# Patient Record
Sex: Female | Born: 1995
Health system: Southern US, Community
[De-identification: ages and names within clinical notes are randomized; demographics above are authoritative.]

## PROBLEM LIST (undated history)

## (undated) DIAGNOSIS — F329 Major depressive disorder, single episode, unspecified: Secondary | ICD-10-CM

## (undated) DIAGNOSIS — F32A Depression, unspecified: Secondary | ICD-10-CM

## (undated) DIAGNOSIS — N920 Excessive and frequent menstruation with regular cycle: Secondary | ICD-10-CM

## (undated) DIAGNOSIS — N946 Dysmenorrhea, unspecified: Secondary | ICD-10-CM

## (undated) HISTORY — DX: Excessive and frequent menstruation with regular cycle: N92.0

## (undated) HISTORY — DX: Dysmenorrhea, unspecified: N94.6

## (undated) HISTORY — DX: Depression, unspecified: F32.A

## (undated) HISTORY — PX: COLONOSCOPY: SHX174

---
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
## 2015-04-09 ENCOUNTER — Ambulatory Visit (INDEPENDENT_AMBULATORY_CARE_PROVIDER_SITE_OTHER): Payer: PRIVATE HEALTH INSURANCE | Admitting: Obstetrics and Gynecology

## 2015-04-09 ENCOUNTER — Encounter: Payer: Self-pay | Admitting: Obstetrics and Gynecology

## 2015-04-09 VITALS — BP 104/67 | HR 92 | Ht 64.0 in | Wt 116.0 lb

## 2015-04-09 DIAGNOSIS — N946 Dysmenorrhea, unspecified: Secondary | ICD-10-CM | POA: Diagnosis not present

## 2015-04-09 DIAGNOSIS — N920 Excessive and frequent menstruation with regular cycle: Secondary | ICD-10-CM

## 2015-04-09 DIAGNOSIS — N943 Premenstrual tension syndrome: Secondary | ICD-10-CM | POA: Diagnosis not present

## 2015-04-09 NOTE — Progress Notes (Signed)
Patient ID: Ashley Blevins, female   DOB: 1996-01-31, 19 y.o.   MRN: [id number] np wants pill to control heavy bleeding Cycle comes q 30 days- last 6-7 days, 4-5 heavy days- changing q 2 hours Pos cramps - relieved with heating pad and ibup most times  Chief complaint: 1.  Dysmenorrhea. 2.  Menorrhagia. 26.  PMS  19 year old P0 white female, not sexually active, on no birth control, who presents for management of above complaints.  She has no significant health issues.  She is starting college at [employer] in 1 week. Patient has history of severe cramps requiring upwards of 10-12.  Advil a day.  She also has had heavy bleeding in association with the cramps that has occasionally caused her to miss school. Medication patient does have irritability symptoms that began within 1-2 weeks prior to menses.  History reviewed. No pertinent past medical history. Past Surgical History  Procedure Laterality Date  . Wisdom tooth extraction  2013     OBJECTIVE: BP 104/67 mmHg  Pulse 92  Ht  (1.626 m)  Wt 116 lb (52.617 kg)  BMI 19.90 kg/m2  LMP 03/11/2015 (Exact Date) Patient is a pleasant, well-appearing teen in no acute distress  IMPRESSION: 1.  Primary dysmenorrhea. 2.  Menorrhagia. 3.  PMS.  PLAN: 1.  Begin low-dose OCP (Minastrin 24) 2.  Menstrual calendar, monitoring 3.  Symptom monitoring. 4.  Exercise and healthy eating encouraged 5.  Return in 3 months for follow-up on birth control pills and Blood pressure assessment.  A total of 30 minutes were spent face-to-face with the patient during the encounter with greater than 50% dealing with counseling and coordination of care.

## 2015-04-09 NOTE — Patient Instructions (Signed)
1.  Begin low-dose OCP (Minastrin 24) 2.  Menstrual calendar, monitoring 3.  Symptom monitoring. 4.  Exercise and healthy eating encouraged 5.  Return in 3 months for follow-up on birth control pills and Blood pressure assessment.

## 2015-06-18 ENCOUNTER — Telehealth: Payer: Self-pay | Admitting: Obstetrics and Gynecology

## 2015-06-18 NOTE — Telephone Encounter (Signed)
Pt has been on ocp since 03/2015- she states she is taking it same time qd. Having bleeding q 2 weeks. Advised to continue with ocp. Continue with menstrual calendar monitoring. f/u appt made for 11/15.

## 2015-06-18 NOTE — Telephone Encounter (Signed)
pts mother called and dr Tom[facility]umpsde [facility], and she has not had a period when she is suppose to, she is having break thru bleeding, she said taht the break thru bleeding is a full on period, and its not hte time she is suppose to be on her period, so she wanted to know what her daughter needs to do. Does the [facility] need to change or does Dr Tommi Rumpse need to see her? Mother said we can call her daughter back she was just calling in for her.

## 2015-07-09 ENCOUNTER — Ambulatory Visit: Payer: PRIVATE HEALTH INSURANCE | Admitting: Obstetrics and Gynecology

## 2015-07-14 ENCOUNTER — Other Ambulatory Visit: Payer: Self-pay | Admitting: Obstetrics and Gynecology

## 2015-07-14 MED ORDER — NORETHIN ACE-ETH ESTRAD-FE 1-20 MG-MCG(24) PO CHEW: 1.0000 | CHEWABLE_TABLET | Freq: Every morning | ORAL | Status: DC

## 2015-07-17 ENCOUNTER — Encounter: Payer: Self-pay | Admitting: Obstetrics and Gynecology

## 2015-07-17 ENCOUNTER — Ambulatory Visit (INDEPENDENT_AMBULATORY_CARE_PROVIDER_SITE_OTHER): Payer: PRIVATE HEALTH INSURANCE | Admitting: Obstetrics and Gynecology

## 2015-07-17 VITALS — BP 100/64 | HR 84 | Ht 64.0 in | Wt 113.3 lb

## 2015-07-17 DIAGNOSIS — N946 Dysmenorrhea, unspecified: Secondary | ICD-10-CM | POA: Diagnosis not present

## 2015-07-17 DIAGNOSIS — N920 Excessive and frequent menstruation with regular cycle: Secondary | ICD-10-CM | POA: Diagnosis not present

## 2015-07-17 DIAGNOSIS — N943 Premenstrual tension syndrome: Secondary | ICD-10-CM | POA: Diagnosis not present

## 2015-07-17 DIAGNOSIS — B2790 Infectious mononucleosis, unspecified without complication: Secondary | ICD-10-CM | POA: Diagnosis not present

## 2015-07-17 MED ORDER — NORETHIN ACE-ETH ESTRAD-FE 1-20 MG-MCG(24) PO CHEW: 1.0000 | CHEWABLE_TABLET | Freq: Every morning | ORAL | Status: DC

## 2015-07-17 NOTE — Progress Notes (Signed)
Patient ID: Ashley Blevins, female   DOB: 1995/12/29, 19 y.o.   MRN: [id number]  3 month f/u on ocp  cycles this month lasted only 5 days- no pain  Chief complaint: 1.  Dysmenorrhea. 2.  Menorrhagia. 3.  PMDD.  Patient presents today for follow-up on above issues.  She was started on On oral contraceptives. She has not been Having abnormal uterine bleeding; dysmenorrhea has diminished where she no Longer needs to take ibuprofen; her moods have improved to where she is not experiencing any significant PMS symptoms.  [name] just started at [employer] and has had a difficult first semester, having contracted mononucleosis causing her to miss several weeks of school.  She is now recuperated and is coping well.  OBJECTIVE BP 100/64 mmHg  Pulse 84  Ht 5\' 4"  (1.626 m)  Wt 113 lb 4.8 oz (51.393 kg)  BMI 19.44 kg/m2  LMP 07/10/2015   ASSESSMENT: 1.  Dysmenorrhea, resolved. 2.  Menorrhagia with irregular cycles, resolved. 3.  PMDD, reduced  PLAN: Continue with oral contraceptives as written Return in April 2017 for annual follow-up  A total of 15 minutes were spent face-to-face with the patient during this encounter and over half of that time dealt with counseling and coordination of care.  Herold HarmsMartin A Kashae Carstens, MD

## 2015-07-17 NOTE — Patient Instructions (Signed)
1.  Continue with oral contraceptives  2.  Monitor for any abnormal uterine bleeding, worsening pelvic pain, or irritability.

## 2015-12-18 ENCOUNTER — Telehealth: Payer: Self-pay | Admitting: *Deleted

## 2015-12-18 NOTE — Telephone Encounter (Signed)
Patient mother came into office today and states that the patient  is experiencing some high anxiety. Patient mother wants to talk to the nurse or Dr. Tommi Rumpse to see what she can do for her daughter. The patient is still in college and may be able to come in for an appt. Patient mother states she needs some advise on what to do to help her daughter. Patient mother is requesting a call back  Her call back is [phone]

## 2015-12-19 NOTE — Telephone Encounter (Signed)
Per kc- Amy states [name] can not come in tomorrow at 8. Per Amy mad will speak to her tomorrow and let us know when she can come in.

## 2016-01-27 ENCOUNTER — Other Ambulatory Visit: Payer: Self-pay

## 2016-01-28 MED ORDER — SERTRALINE HCL 50 MG PO TABS: 50.0000 mg | ORAL_TABLET | Freq: Every day | ORAL | Status: DC

## 2016-01-28 NOTE — Telephone Encounter (Signed)
Per mad.

## 2016-03-24 ENCOUNTER — Ambulatory Visit: Payer: PRIVATE HEALTH INSURANCE | Admitting: Obstetrics and Gynecology

## 2016-03-31 ENCOUNTER — Ambulatory Visit: Payer: PRIVATE HEALTH INSURANCE | Admitting: Obstetrics and Gynecology

## 2016-04-08 ENCOUNTER — Ambulatory Visit (INDEPENDENT_AMBULATORY_CARE_PROVIDER_SITE_OTHER): Payer: PRIVATE HEALTH INSURANCE | Admitting: Obstetrics and Gynecology

## 2016-04-08 ENCOUNTER — Encounter: Payer: Self-pay | Admitting: Obstetrics and Gynecology

## 2016-04-08 VITALS — BP 112/76 | HR 69 | Wt 112.0 lb

## 2016-04-08 DIAGNOSIS — N946 Dysmenorrhea, unspecified: Secondary | ICD-10-CM | POA: Diagnosis not present

## 2016-04-08 DIAGNOSIS — N920 Excessive and frequent menstruation with regular cycle: Secondary | ICD-10-CM

## 2016-04-08 DIAGNOSIS — N943 Premenstrual tension syndrome: Secondary | ICD-10-CM

## 2016-04-08 MED ORDER — SERTRALINE HCL 50 MG PO TABS: 50.0000 mg | ORAL_TABLET | Freq: Every day | ORAL | 6 refills | Status: DC

## 2016-04-08 MED ORDER — NORETHIN ACE-ETH ESTRAD-FE 1-20 MG-MCG(24) PO CHEW: 1.0000 | CHEWABLE_TABLET | Freq: Every morning | ORAL | 1 refills | Status: DC

## 2016-04-08 NOTE — Progress Notes (Signed)
Chief complaint: 1. Anxiety/depression 2. Dysmenorrhea 3. Menorrhagia  Patient presents for follow-up on sertraline medication prescribed for significant anxiety/depression symptoms. Patient has been getting counseling. She is doing well with counseling and medication. No side effects of headaches or abdominal pelvic cramping  Patient is also on oral contraceptives for regulation of dysmenorrhea and menorrhagia. Menses are very light and painless.  OBJECTIVE: BP 112/76   Pulse 69   Wt 112 lb (50.8 kg)   LMP 03/21/2016 (Exact Date)   BMI 19.22 kg/m   Mood-normal Affect-normal  Assessment 1. Anxiety/depression, stable on medication 2. Dysmenorrhea, resolved with OCPs 3. Menorrhagia, resolved with OCPs  PLAN: 1. Continue Zoloft 50 mg a day 2. Continue OCPs 3. Return in 6 months for follow-up  A total of 15 minutes were spent face-to-face with the patient during this encounter and over half of that time dealt with counseling and coordination of care.  Herold HarmsMartin A Dashawna Delbridge, MD  Note: This dictation was prepared with Dragon dictation along with smaller phrase technology. Any transcriptional errors that result from this process are unintentional.

## 2016-04-08 NOTE — Patient Instructions (Signed)
1. Continue Zoloft 50 mg a day 2. Continue birth control pills 3. Return in 6 months for follow-up

## 2016-10-13 ENCOUNTER — Ambulatory Visit: Payer: PRIVATE HEALTH INSURANCE | Admitting: Obstetrics and Gynecology

## 2016-11-04 ENCOUNTER — Encounter: Payer: Self-pay | Admitting: Obstetrics and Gynecology

## 2016-11-04 ENCOUNTER — Ambulatory Visit (INDEPENDENT_AMBULATORY_CARE_PROVIDER_SITE_OTHER): Payer: PRIVATE HEALTH INSURANCE | Admitting: Obstetrics and Gynecology

## 2016-11-04 VITALS — BP 108/74 | HR 94 | Ht 64.0 in | Wt 123.9 lb

## 2016-11-04 DIAGNOSIS — N920 Excessive and frequent menstruation with regular cycle: Secondary | ICD-10-CM | POA: Diagnosis not present

## 2016-11-04 DIAGNOSIS — N943 Premenstrual tension syndrome: Secondary | ICD-10-CM | POA: Diagnosis not present

## 2016-11-04 DIAGNOSIS — N946 Dysmenorrhea, unspecified: Secondary | ICD-10-CM

## 2016-11-04 MED ORDER — SERTRALINE HCL 50 MG PO TABS: 50.0000 mg | ORAL_TABLET | Freq: Every day | ORAL | 1 refills | Status: DC

## 2016-11-04 NOTE — Patient Instructions (Signed)
1. Continue taking birth control pills as written 2. Continue taking Zoloft 50 mg a day 3. Return in December 2018 for annual exam-first Pap smear

## 2016-11-04 NOTE — Progress Notes (Signed)
Chief complaint: 1. Contraceptive management 2. History of dysmenorrhea 3. History of PMS 4. Anxiety  Patient presents for 6 month follow-up. She is doing very well. Zoloft 50 mg a day is helping to control anxiety symptoms: She is not expressing any side effects. Birth control pills are working well for controlling dysmenorrhea; periods are very light without cramps.  Past medical history, past surgical history, problem list, medications, and allergies are reviewed  Review of Systems  Constitutional: Negative for chills, fever, malaise/fatigue and weight loss.  HENT: Negative.  Negative for ear pain.   Eyes: Negative.   Respiratory: Positive for cough. Negative for shortness of breath.   Cardiovascular: Negative.  Negative for chest pain and palpitations.  Gastrointestinal: Negative for constipation, diarrhea, nausea and vomiting.  Genitourinary: Negative.   Musculoskeletal: Negative for myalgias.  Neurological: Positive for headaches.  Psychiatric/Behavioral:       Anxiety, controlled on medication PMS, controlled on medication   OBJECTIVE: BP 108/74   Pulse 94   Ht 5\' 4"  (1.626 m)   Wt 123 lb 14.4 oz (56.2 kg)   LMP 11/01/2016 (Exact Date)   BMI 21.27 kg/m  Physical exam-deferred  ASSESSMENT: 1. History of dysmenorrhea, controlled on medication-OCPs 2. History of PMS, controlled on medication 3. Anxiety, controlled on medication-Zoloft  PLAN: 1. Continue birth control pills as written 2. Continue with Zoloft 50 mg a day 3. Return in mid December for her first annual exam with Pap smear  A total of 15 minutes were spent face-to-face with the patient during this encounter and over half of that time dealt with counseling and coordination of care.  Herold HarmsMartin A Defrancesco, MD  Note: This dictation was prepared with Dragon dictation along with smaller phrase technology. Any transcriptional errors that result from this process are unintentional.

## 2016-11-27 ENCOUNTER — Other Ambulatory Visit: Payer: Self-pay | Admitting: Obstetrics and Gynecology

## 2017-06-09 ENCOUNTER — Other Ambulatory Visit: Payer: Self-pay | Admitting: Obstetrics and Gynecology

## 2017-06-09 NOTE — Telephone Encounter (Signed)
See message.

## 2017-06-09 NOTE — Telephone Encounter (Signed)
Patient is leaving @ 5am tomorrow morning to go out of town and needs to pick up medication today - [facility]  Please call

## 2017-06-14 ENCOUNTER — Other Ambulatory Visit: Payer: Self-pay | Admitting: *Deleted

## 2017-06-14 MED ORDER — MIBELAS 24 FE 1-20 MG-MCG(24) PO CHEW: CHEWABLE_TABLET | ORAL | 1 refills | Status: DC

## 2017-08-10 ENCOUNTER — Encounter: Payer: PRIVATE HEALTH INSURANCE | Admitting: Obstetrics and Gynecology

## 2017-08-12 ENCOUNTER — Other Ambulatory Visit: Payer: Self-pay | Admitting: Obstetrics and Gynecology

## 2017-08-26 ENCOUNTER — Ambulatory Visit (INDEPENDENT_AMBULATORY_CARE_PROVIDER_SITE_OTHER): Payer: PRIVATE HEALTH INSURANCE | Admitting: Obstetrics and Gynecology

## 2017-08-26 ENCOUNTER — Other Ambulatory Visit: Payer: Self-pay | Admitting: Obstetrics and Gynecology

## 2017-08-26 ENCOUNTER — Encounter: Payer: Self-pay | Admitting: Obstetrics and Gynecology

## 2017-08-26 VITALS — BP 114/79 | HR 92 | Ht 64.0 in | Wt 133.3 lb

## 2017-08-26 DIAGNOSIS — Z01419 Encounter for gynecological examination (general) (routine) without abnormal findings: Secondary | ICD-10-CM | POA: Diagnosis not present

## 2017-08-26 NOTE — Patient Instructions (Signed)
Preventive Care 18-39 Years, Female Preventive care refers to lifestyle choices and visits with your health care provider that can promote health and wellness. What does preventive care include?  A yearly physical exam. This is also called an annual well check.  Dental exams once or twice a year.  Routine eye exams. Ask your health care provider how often you should have your eyes checked.  Personal lifestyle choices, including: ? Daily care of your teeth and gums. ? Regular physical activity. ? Eating a healthy diet. ? Avoiding tobacco and drug use. ? Limiting alcohol use. ? Practicing safe sex. ? Taking vitamin and mineral supplements as recommended by your health care provider. What happens during an annual well check? The services and screenings done by your health care provider during your annual well check will depend on your age, overall health, lifestyle risk factors, and family history of disease. Counseling Your health care provider may ask you questions about your:  Alcohol use.  Tobacco use.  Drug use.  Emotional well-being.  Home and relationship well-being.  Sexual activity.  Eating habits.  Work and work Statistician.  Method of birth control.  Menstrual cycle.  Pregnancy history.  Screening You may have the following tests or measurements:  Height, weight, and BMI.  Diabetes screening. This is done by checking your blood sugar (glucose) after you have not eaten for a while (fasting).  Blood pressure.  Lipid and cholesterol levels. These may be checked every 5 years starting at age 38.  Skin check.  Hepatitis C blood test.  Hepatitis B blood test.  Sexually transmitted disease (STD) testing.  BRCA-related cancer screening. This may be done if you have a family history of breast, ovarian, tubal, or peritoneal cancers.  Pelvic exam and Pap test. This may be done every 3 years starting at age 38. Starting at age 30, this may be done  every 5 years if you have a Pap test in combination with an HPV test.  Discuss your test results, treatment options, and if necessary, the need for more tests with your health care provider. Vaccines Your health care provider may recommend certain vaccines, such as:  Influenza vaccine. This is recommended every year.  Tetanus, diphtheria, and acellular pertussis (Tdap, Td) vaccine. You may need a Td booster every 10 years.  Varicella vaccine. You may need this if you have not been vaccinated.  HPV vaccine. If you are 39 or younger, you may need three doses over 6 months.  Measles, mumps, and rubella (MMR) vaccine. You may need at least one dose of MMR. You may also need a second dose.  Pneumococcal 13-valent conjugate (PCV13) vaccine. You may need this if you have certain conditions and were not previously vaccinated.  Pneumococcal polysaccharide (PPSV23) vaccine. You may need one or two doses if you smoke cigarettes or if you have certain conditions.  Meningococcal vaccine. One dose is recommended if you are age 68-21 years and a first-year college student living in a residence hall, or if you have one of several medical conditions. You may also need additional booster doses.  Hepatitis A vaccine. You may need this if you have certain conditions or if you travel or work in places where you may be exposed to hepatitis A.  Hepatitis B vaccine. You may need this if you have certain conditions or if you travel or work in places where you may be exposed to hepatitis B.  Haemophilus influenzae type b (Hib) vaccine. You may need this  if you have certain risk factors.  Talk to your health care provider about which screenings and vaccines you need and how often you need them. This information is not intended to replace advice given to you by your health care provider. Make sure you discuss any questions you have with your health care provider. Document Released: 10/06/2001 Document Revised:  04/29/2016 Document Reviewed: 06/11/2015 Elsevier Interactive Patient Education  2018 Elsevier Inc.  

## 2017-08-26 NOTE — Progress Notes (Signed)
  Subjective:     Ashley Blevins is a single white  22 y.o. female and is here for a comprehensive physical exam. 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 Lives in a house with roommates. Is exercising regularly. Not sexually active. No smoking and occasional alcohol intake.  The patient reports no problems.  Social History   Socioeconomic History  . Marital status: Single    Spouse name: Not on file  . Number of children: Not on file  . Years of education: Not on file  . Highest education level: Not on file  Social Needs  . Financial resource strain: Not on file  . Food insecurity - worry: Not on file  . Food insecurity - inability: Not on file  . Transportation needs - medical: Not on file  . Transportation needs - non-medical: Not on file  Occupational History  . Not on file  Tobacco Use  . Smoking status: Never Smoker  . Smokeless tobacco: Never Used  Substance and Sexual Activity  . Alcohol use: No  . Drug use: No  . Sexual activity: Yes    Birth control/protection: Pill, Condom  Other Topics Concern  . Not on file  Social History Narrative  . Not on file   Health Maintenance  Topic Date Due  . CHLAMYDIA SCREENING  06/16/2011  . HIV Screening  06/16/2011  . TETANUS/TDAP  06/16/2015  . INFLUENZA VACCINE  03/24/2017  . PAP SMEAR  06/15/2017    The following portions of the patient's history were reviewed and updated as appropriate: allergies, current medications, past family history, past medical history, past social history, past surgical history and problem list.  Review of Systems Pertinent items noted in HPI and remainder of comprehensive ROS otherwise negative.   Objective:    General appearance: alert, cooperative and appears stated age Neck: no adenopathy, no carotid bruit, no JVD, supple, symmetrical, trachea midline and thyroid not enlarged, symmetric, no tenderness/mass/nodules Lungs: clear to auscultation bilaterally Breasts: normal  appearance, no masses or tenderness Heart: regular rate and rhythm, S1, S2 normal, no murmur, click, rub or gallop Abdomen: soft, non-tender; bowel sounds normal; no masses,  no organomegaly Pelvic: cervix normal in appearance, external genitalia normal, no adnexal masses or tenderness, no cervical motion tenderness, rectovaginal septum normal, uterus normal size, shape, and consistency, vagina normal without discharge and 3 small dark nevi noted scattered on labia    Assessment:    Healthy female exam. OCP user; depression under good control with SSRI     Plan:    RTC 1 year or as needed.  Pablo Mathurin,CNM See After Visit Summary for Counseling Recommendations

## 2017-08-27 LAB — CYTOLOGY - PAP

## 2017-09-20 ENCOUNTER — Other Ambulatory Visit: Payer: Self-pay

## 2017-09-20 MED ORDER — SERTRALINE HCL 50 MG PO TABS: 50.0000 mg | ORAL_TABLET | Freq: Every day | ORAL | 1 refills | Status: DC

## 2017-11-23 ENCOUNTER — Other Ambulatory Visit: Payer: Self-pay | Admitting: Obstetrics and Gynecology

## 2018-03-08 ENCOUNTER — Telehealth: Payer: Self-pay | Admitting: Obstetrics and Gynecology

## 2018-03-08 MED ORDER — SERTRALINE HCL 50 MG PO TABS: 50.0000 mg | ORAL_TABLET | Freq: Every day | ORAL | 1 refills | Status: DC

## 2018-03-08 NOTE — Telephone Encounter (Signed)
Ashley Blevins called for her daughter, [name], asking [place] [facility] [place] [address] [[phone]] before 10 AM if possible as she is completely out of her Zoloft and has to be at work by [employer] today.  Ashley Blevins's call back number is [phone], please advise, thanks.

## 2018-03-08 NOTE — Telephone Encounter (Signed)
Pt vm not set up yet. Called Mom(amy) she is aware med is erx.

## 2018-08-05 ENCOUNTER — Other Ambulatory Visit: Payer: Self-pay | Admitting: Obstetrics and Gynecology

## 2018-08-27 ENCOUNTER — Other Ambulatory Visit: Payer: Self-pay | Admitting: Obstetrics and Gynecology

## 2018-08-29 ENCOUNTER — Other Ambulatory Visit: Payer: Self-pay | Admitting: Obstetrics and Gynecology

## 2018-09-14 ENCOUNTER — Encounter: Payer: PRIVATE HEALTH INSURANCE | Admitting: Obstetrics and Gynecology

## 2018-09-19 ENCOUNTER — Telehealth: Payer: Self-pay

## 2018-09-19 NOTE — Telephone Encounter (Signed)
Pt called no answer LM via voicemail to call the office concerning her appointment with [facility] tomorrow.

## 2018-09-20 ENCOUNTER — Encounter: Payer: Self-pay | Admitting: Obstetrics and Gynecology

## 2018-09-20 ENCOUNTER — Ambulatory Visit (INDEPENDENT_AMBULATORY_CARE_PROVIDER_SITE_OTHER): Payer: BLUE CROSS/BLUE SHIELD | Admitting: Obstetrics and Gynecology

## 2018-09-20 VITALS — BP 107/77 | HR 85 | Ht 64.0 in | Wt 133.1 lb

## 2018-09-20 DIAGNOSIS — Z01419 Encounter for gynecological examination (general) (routine) without abnormal findings: Secondary | ICD-10-CM | POA: Diagnosis not present

## 2018-09-20 DIAGNOSIS — Z8659 Personal history of other mental and behavioral disorders: Secondary | ICD-10-CM

## 2018-09-20 DIAGNOSIS — J029 Acute pharyngitis, unspecified: Secondary | ICD-10-CM | POA: Diagnosis not present

## 2018-09-20 NOTE — Patient Instructions (Signed)
Breast Self-Awareness  Breast self-awareness means:  · Knowing how your breasts look.  · Knowing how your breasts feel.  · Checking your breasts every month for changes.  · Telling your doctor if you notice a change in your breasts.  Breast self-awareness allows you to notice a breast problem early while it is still small.  How to do a breast self-exam  One way to learn what is normal for your breasts and to check for changes is to do a breast self-exam. To do a breast self-exam:  Look for Changes    1. Take off all the clothes above your waist.  2. Stand in front of a mirror in a room with good lighting.  3. Put your hands on your hips.  4. Push your hands down.  5. Look at your breasts and nipples in the mirror to see if one breast or nipple looks different than the other. Check to see if:  ? The shape of one breast is different.  ? The size of one breast is different.  ? There are wrinkles, dips, and bumps in one breast and not the other.  6. Look at each breast for changes in your skin, such as:  ? Redness.  ? Scaly areas.  7. Look for changes in your nipples, such as:  ? Liquid around the nipples.  ? Bleeding.  ? Dimpling.  ? Redness.  ? A change in where the nipples are.  Feel for Changes  1. Lie on your back on the floor.  2. Feel each breast. To do this, follow these steps:  ? Pick a breast to feel.  ? Put the arm closest to that breast above your head.  ? Use your other arm to feel the nipple area of your breast. Feel the area with the pads of your three middle fingers by making small circles with your fingers. For the first circle, press lightly. For the second circle, press harder. For the third circle, press even harder.  ? Keep making circles with your fingers at the light, harder, and even harder pressures as you move down your breast. Stop when you feel your ribs.  ? Move your fingers a little toward the center of your body.  ? Start making circles with your fingers again, this time going up until you  reach your collarbone.  ? Keep making up and down circles until you reach your armpit. Remember to keep using the three pressures.  ? Feel the other breast in the same way.  3. Sit or stand in the shower or tub.  4. With soapy water on your skin, feel each breast the same way you did in step 2, when you were lying on the floor.  Write Down What You Find  After doing the self-exam, write down:  · What is normal for each breast.  · Any changes you find in each breast.  · When you last had your period.    How often should I check my breasts?  Check your breasts every month. If you are breastfeeding, the best time to check them is after you feed your baby or after you use a breast pump. If you get periods, the best time to check your breasts is 5-7 days after your period is over.  When should I see my doctor?  See your doctor if you notice:  · A change in shape or size of your breasts or nipples.  · A change in the skin   of your breast or nipples, such as red or scaly skin.  · Unusual fluid coming from your nipples.  · A lump or thick area that was not there before.  · Pain in your breasts.  · Anything that concerns you.  This information is not intended to replace advice given to you by your health care provider. Make sure you discuss any questions you have with your health care provider.  Document Released: 01/27/2008 Document Revised: 01/16/2016 Document Reviewed: 06/30/2015  Elsevier Interactive Patient Education © 2019 Elsevier Inc.

## 2018-09-20 NOTE — Progress Notes (Signed)
PT is present today for her annual exam. Pt's lst pap and annual exam was on 08/26/17 with result of pap being negative. [facility]  Pt stated that she has not been doing self-breast exams monthly. Self- breast exam was discussed and added patient instructions in check out on how to do self-exam. Pt stated that she is doing well and denies any issues. No problems or concerns.

## 2018-09-20 NOTE — Progress Notes (Signed)
GYNECOLOGY ANNUAL PHYSICAL EXAM PROGRESS NOTE  Subjective:    Ashley Blevins is a 23 y.o. G0P0000 female who presents for an annual exam. The patient has no major complaints today. The patient is sexually active. The patient wears seatbelts: yes. The patient participates in regular exercise: yes. Has the patient ever been transfused or tattooed?: yes. The patient reports that there is not domestic violence in her life.    Gynecologic History  Menarche age:  Patient's last menstrual period was 09/03/2018.  Contraception: OCP (estrogen/progesterone) History of STI's: Denies Last Pap: 08/26/2017. Results were: normal.  Denies h/o abnormal pap smears.    OB History  Gravida Para Term Preterm AB Living  0 0 0 0 0 0  SAB TAB Ectopic Multiple Live Births  0 0 0 0 0    Past Medical History:  Diagnosis Date  . Dysmenorrhea   . Menorrhagia     Past Surgical History:  Procedure Laterality Date  . WISDOM TOOTH EXTRACTION  2013    Family History  Problem Relation Age of Onset  . Healthy Mother   . Healthy Father   . Cancer Neg Hx   . Diabetes Neg Hx   . Heart disease Neg Hx     Social History   Socioeconomic History  . Marital status: Single    Spouse name: Not on file  . Number of children: Not on file  . Years of education: Not on file  . Highest education level: Not on file  Occupational History  . Not on file  Social Needs  . Financial resource strain: Not on file  . Food insecurity:    Worry: Not on file    Inability: Not on file  . Transportation needs:    Medical: Not on file    Non-medical: Not on file  Tobacco Use  . Smoking status: Never Smoker  . Smokeless tobacco: Never Used  Substance and Sexual Activity  . Alcohol use: Yes    Comment: socially  . Drug use: No  . Sexual activity: Yes    Birth control/protection: Pill, Condom  Lifestyle  . Physical activity:    Days per week: Not on file    Minutes per session: Not on file  . Stress: Not on  file  Relationships  . Social connections:    Talks on phone: Not on file    Gets together: Not on file    Attends religious service: Not on file    Active member of club or organization: Not on file    Attends meetings of clubs or organizations: Not on file    Relationship status: Not on file  . Intimate partner violence:    Fear of current or ex partner: Not on file    Emotionally abused: Not on file    Physically abused: Not on file    Forced sexual activity: Not on file  Other Topics Concern  . Not on file  Social History Narrative  . Not on file    Current Outpatient Medications on File Prior to Visit  Medication Sig Dispense Refill  . MIBELAS 24 FE 1-20 MG-MCG(24) CHEW TAKE 1 TABLET BY MOUTH DAILY 84 tablet 0  . sertraline (ZOLOFT) 50 MG tablet TAKE ONE TABLET (50MG ) BY MOUTH DAILY 90 tablet 0   No current facility-administered medications on file prior to visit.     No Known Allergies    Review of Systems Constitutional: negative for chills, fatigue, fevers and sweats Eyes:  negative for irritation, redness and visual disturbance Ears, nose, mouth, throat, and face: negative for hearing loss, nasal congestion, snoring and tinnitus.  Positive for sore throat x 2 days.  Respiratory: negative for asthma, cough, sputum Cardiovascular: negative for chest pain, dyspnea, exertional chest pressure/discomfort, irregular heart beat, palpitations and syncope Gastrointestinal: negative for abdominal pain, change in bowel habits, nausea and vomiting Genitourinary: negative for abnormal menstrual periods, genital lesions, sexual problems and vaginal discharge, dysuria and urinary incontinence Integument/breast: negative for breast lump, breast tenderness and nipple discharge Hematologic/lymphatic: negative for bleeding and easy bruising Musculoskeletal:negative for back pain and muscle weakness Neurological: negative for dizziness, headaches, vertigo and weakness Endocrine:  negative for diabetic symptoms including polydipsia, polyuria and skin dryness Allergic/Immunologic: negative for hay fever and urticaria        Objective:  Blood pressure 107/77, pulse 85, height 5\' 4"  (1.626 m), weight 133 lb 1.6 oz (60.4 kg), last menstrual period 09/03/2018. Body mass index is 22.85 kg/m.  General Appearance:    Alert, cooperative, no distress, appears stated age  Head:    Normocephalic, without obvious abnormality, atraumatic  Eyes:    PERRL, conjunctiva/corneas clear, EOM's intact, both eyes  Ears:    Normal external ear canals, both ears  Nose:   Nares normal, septum midline, mucosa normal, no drainage or sinus tenderness  Throat:   Lips, mucosa, and tongue normal; Faint erythema noted at tonsils.  Teeth and gums normal  Neck:   Supple, symmetrical, trachea midline, no adenopathy; thyroid: no enlargement/tenderness/nodules; no carotid bruit or JVD  Back:     Symmetric, no curvature, ROM normal, no CVA tenderness  Lungs:     Clear to auscultation bilaterally, respirations unlabored  Chest Wall:    No tenderness or deformity   Heart:    Regular rate and rhythm, S1 and S2 normal, no murmur, rub or gallop  Breast Exam:    No tenderness, masses, or nipple abnormality  Abdomen:     Soft, non-tender, bowel sounds active all four quadrants, no masses, no organomegaly.    Genitalia:    Pelvic:external genitalia normal, vagina without lesions, discharge, or tenderness, rectovaginal septum  normal. Cervix normal in appearance, no cervical motion tenderness, no adnexal masses or tenderness.  Uterus normal size, shape, mobile, regular contours, nontender.  Rectal:    Normal external sphincter.  No hemorrhoids appreciated. Internal exam not done.   Extremities:   Extremities normal, atraumatic, no cyanosis or edema  Pulses:   2+ and symmetric all extremities  Skin:   Skin color, texture, turgor normal, no rashes or lesions  Lymph nodes:   Cervical, supraclavicular, and axillary  nodes normal  Neurologic:   CNII-XII intact, normal strength, sensation and reflexes throughout   .  Labs:  No results found for: WBC, HGB, HCT, MCV, PLT  No results found for: CREATININE, BUN, NA, K, CL, CO2  No results found for: ALT, AST, GGT, ALKPHOS, BILITOT  No results found for: TSH   Assessment:   Routine gynecologic exam.  Sore throat H/o depression   Plan:     Blood tests: CBC with diff and Comprehensive metabolic panel. Patient will return for labs.  Breast self exam technique reviewed and patient encouraged to perform self-exam monthly. Contraception: OCP (estrogen/progesterone). Pap smear up to date.   Sore throat mild, no other symptoms. Patient was concnerned as she has had a h/o tonsillitis twice and tonsillith. Advised on gargling with saline water/peroxide, throat lozenges,  drinking warm fluids (I.e. tea with  honey). If symptoms progress, can follow up.  Is up to date on flu vaccine.  Discussed HPV vaccine. Given handout.  H/o depression, currently managed on Zoloft. Is followed by her therapist.  Follow up in 1 year for annual exam.   Hildred Laser, MD [facility]

## 2018-10-17 DIAGNOSIS — J039 Acute tonsillitis, unspecified: Secondary | ICD-10-CM | POA: Diagnosis not present

## 2018-10-22 ENCOUNTER — Other Ambulatory Visit: Payer: Self-pay | Admitting: Obstetrics and Gynecology

## 2018-10-28 DIAGNOSIS — J0300 Acute streptococcal tonsillitis, unspecified: Secondary | ICD-10-CM | POA: Diagnosis not present

## 2018-12-16 ENCOUNTER — Other Ambulatory Visit: Payer: Self-pay | Admitting: Obstetrics and Gynecology

## 2018-12-16 NOTE — Telephone Encounter (Signed)
Refill sent.

## 2019-02-13 ENCOUNTER — Other Ambulatory Visit: Payer: Self-pay | Admitting: Obstetrics and Gynecology

## 2019-02-14 DIAGNOSIS — Z1159 Encounter for screening for other viral diseases: Secondary | ICD-10-CM | POA: Diagnosis not present

## 2019-03-11 ENCOUNTER — Other Ambulatory Visit: Payer: Self-pay | Admitting: Obstetrics and Gynecology

## 2019-03-27 DIAGNOSIS — J0300 Acute streptococcal tonsillitis, unspecified: Secondary | ICD-10-CM | POA: Diagnosis not present

## 2019-03-30 ENCOUNTER — Other Ambulatory Visit: Payer: Self-pay

## 2019-03-30 ENCOUNTER — Encounter: Payer: Self-pay | Admitting: *Deleted

## 2019-03-30 NOTE — Discharge Instructions (Signed)
T & A INSTRUCTION SHEET - [facility]  Linus Salmons, MD  [address] [place], [place] [address] TEL.  [phone]  INFORMATION SHEET FOR A TONSILLECTOMY AND ADENDOIDECTOMY  About Your Tonsils and Adenoids  The tonsils and adenoids are normal body tissues that are part of our immune system.  They normally help to protect [place] against diseases that may enter our mouth and nose. However, sometimes the tonsils and/or adenoids become too large and obstruct our breathing, especially at night.    If either of these things happen it helps to remove the tonsils and adenoids in order to become healthier. The operation to remove the tonsils and adenoids is called a tonsillectomy and adenoidectomy.  The Location of Your Tonsils and Adenoids  The tonsils are located in the back of the throat on both side and sit in a cradle of muscles. The adenoids are located in the roof of the mouth, behind the nose, and closely associated with the opening of the Eustachian tube to the ear.  Surgery on Tonsils and Adenoids  A tonsillectomy and adenoidectomy is a short operation which takes about thirty minutes.  This includes being put to sleep and being awakened. Tonsillectomies and adenoidectomies are performed at [facility] and may require observation period in the recovery room prior to going home. Children are required to remain in recovery for at least 45 minutes.   Following the Operation for a Tonsillectomy  A cautery machine is used to control bleeding. Bleeding from a tonsillectomy and adenoidectomy is minimal and postoperatively the risk of bleeding is approximately four percent, although this rarely life threatening.  After your tonsillectomy and adenoidectomy post-op care at home: 1. Our patients are able to go home the same day. You may be given prescriptions for pain medications, if indicated. 2. It is extremely important to  remember that fluid intake is of utmost importance after a tonsillectomy. The amount that you drink must be maintained in the postoperative period. A good indication of whether a child is getting enough fluid is whether his/her urine output is constant. As long as children are urinating or wetting their diaper every 6 - 8 hours this is usually enough fluid intake.   3. Although rare, this is a risk of some bleeding in the first ten days after surgery. This usually occurs between day five and nine postoperatively. This risk of bleeding is approximately four percent. If you or your child should have any bleeding you should remain calm and notify our office or go directly to the emergency room at [facility] where they will contact us. Our doctors are available seven days a week for notification. We recommend sitting up quietly in a chair, place an ice pack on the front of the neck and spitting out the blood gently until we are able to contact you. Adults should gargle gently with ice water and this may help stop the bleeding. If the bleeding does not stop after a short time, i.e. 10 to 15 minutes, or seems to be increasing again, please contact us or go to the hospital.   4. It is common for the pain to be worse at 5 - 7 days postoperatively. This occurs because the scab is peeling off and the mucous membrane (skin of the throat) is growing back where the tonsils were.   5. It is common for a low-grade fever, less than 102, during the first week  after a tonsillectomy and adenoidectomy. It is usually due to not drinking enough liquids, and we suggest your use liquid Tylenol (acetaminophen) or the pain medicine with Tylenol (acetaminophen) prescribed in order to keep your temperature below 102. Please follow the directions on the back of the bottle. 6. Recommendations for post-operative pain in children and adults: a) For Children 12 and younger: Recommendations are for oral Tylenol  (acetaminophen) and oral Motrin (Ibuprofen). Administer the Tylenol (acetaminophen) and Motrin (ibuprofen) as stated on bottle for patient's age/weight. Sometimes it may be necessary to alternate the Tylenol (acetaminophen) and Motrin for improved pain control. Motrin does last slightly longer so many patients benefit from being given this prior to bedtime. All children should avoid Aspirin products for 2 weeks following surgery. b) For children over the age of 105: Tylenol (acetaminophen) is the preferred first choice for pain control. Depending on your child's size, sometimes they will be given a combination of Tylenol (acetaminophen) and hydrocodone medication or sometimes it will be recommended they take Motrin (ibuprofen) in addition to the Tylenol (acetaminophen). Narcotics should always be used with caution in children following surgery as they can suppress their breathing and switching to over the counter Tylenol (acetaminophen) and Motrin (ibuprofen) as soon as possible is recommended. All patients should avoid Aspirin products for 2 weeks following surgery. c) Adults: Usually adults will require a narcotic pain medication following a tonsillectomy. This usually has either hydrocodone or oxycodone in it and can usually be taken every 4 to 6 hours as needed for moderate pain. If the medication does not have Tylenol (acetaminophen) in it, you may also supplement Tylenol (acetaminophen) as needed every 4 to 6 hours for breakthrough or mild pain. Adults should avoid Aspirin, Aleve, Motrin, and Ibuprofen products for 2 weeks following surgery as they can increase your risk of bleeding. 7. If you happen to look in the mirror or into your child's mouth you will see white/gray patches on the back of the throat. This is what a scab looks like in the mouth and is normal after having a tonsillectomy and adenoidectomy. They will disappear once the tonsil areas heal completely. However, it may cause a noticeable odor,  and this too will disappear with time.     8. You or your child may experience ear pain after having a tonsillectomy and adenoidectomy.  This is called referred pain and comes from the throat, but it is felt in the ears.  Ear pain is quite common and expected. It will usually go away after ten days. There is usually nothing wrong with the ears, and it is primarily due to the healing area stimulating the nerve to the ear that runs along the side of the throat. Use either the prescribed pain medicine or Tylenol (acetaminophen) as needed.  9. The throat tissues after a tonsillectomy are obviously sensitive. Smoking around children who have had a tonsillectomy significantly increases the risk of bleeding. DO NOT SMOKE!  General Anesthesia, Adult, Care After This sheet gives you information about how to care for yourself after your procedure. Your health care provider may also give you more specific instructions. If you have problems or questions, contact your health care provider. What can I expect after the procedure? After the procedure, the following side effects are common:  Pain or discomfort at the IV site.  Nausea.  Vomiting.  Sore throat.  Trouble concentrating.  Feeling cold or chills.  Weak or tired.  Sleepiness and fatigue.  Soreness and body aches.  These side effects can affect parts of the body that were not involved in surgery. Follow these instructions at home:  For at least 24 hours after the procedure:  Have a responsible adult stay with you. It is important to have someone help care for you until you are awake and alert.  Rest as needed.  Do not: ? Participate in activities in which you could fall or become injured. ? Drive. ? Use heavy machinery. ? Drink alcohol. ? Take sleeping pills or medicines that cause drowsiness. ? Make important decisions or sign legal documents. ? Take care of children on your own. Eating and drinking  Follow any instructions from  your health care provider about eating or drinking restrictions.  When you feel hungry, start by eating small amounts of foods that are soft and easy to digest (bland), such as toast. Gradually return to your regular diet.  Drink enough fluid to keep your urine pale yellow.  If you vomit, rehydrate by drinking water, juice, or clear broth. General instructions  If you have sleep apnea, surgery and certain medicines can increase your risk for breathing problems. Follow instructions from your health care provider about wearing your sleep device: ? Anytime you are sleeping, including during daytime naps. ? While taking prescription pain medicines, sleeping medicines, or medicines that make you drowsy.  Return to your normal activities as told by your health care provider. Ask your health care provider what activities are safe for you.  Take over-the-counter and prescription medicines only as told by your health care provider.  If you smoke, do not smoke without supervision.  Keep all follow-up visits as told by your health care provider. This is important. Contact a health care provider if:  You have nausea or vomiting that does not get better with medicine.  You cannot eat or drink without vomiting.  You have pain that does not get better with medicine.  You are unable to pass urine.  You develop a skin rash.  You have a fever.  You have redness around your IV site that gets worse. Get help right away if:  You have difficulty breathing.  You have chest pain.  You have blood in your urine or stool, or you vomit blood. Summary  After the procedure, it is common to have a sore throat or nausea. It is also common to feel tired.  Have a responsible adult stay with you for the first 24 hours after general anesthesia. It is important to have someone help care for you until you are awake and alert.  When you feel hungry, start by eating small amounts of foods that are soft and  easy to digest (bland), such as toast. Gradually return to your regular diet.  Drink enough fluid to keep your urine pale yellow.  Return to your normal activities as told by your health care provider. Ask your health care provider what activities are safe for you. This information is not intended to replace advice given to you by your health care provider. Make sure you discuss any questions you have with your health care provider. Document Released: 11/16/2000 Document Revised: 08/13/2017 Document Reviewed: 03/26/2017 Elsevier Patient Education  2020 [employer].

## 2019-03-31 ENCOUNTER — Other Ambulatory Visit: Payer: BLUE CROSS/BLUE SHIELD

## 2019-03-31 DIAGNOSIS — Z01419 Encounter for gynecological examination (general) (routine) without abnormal findings: Secondary | ICD-10-CM | POA: Diagnosis not present

## 2019-04-01 LAB — COMPREHENSIVE METABOLIC PANEL
ALT: 7 IU/L (ref 0–32)
AST: 13 IU/L (ref 0–40)
Albumin/Globulin Ratio: 1.6 (ref 1.2–2.2)
Albumin: 4.2 g/dL (ref 3.9–5.0)
Alkaline Phosphatase: 84 IU/L (ref 39–117)
BUN/Creatinine Ratio: 11 (ref 9–23)
BUN: 9 mg/dL (ref 6–20)
Bilirubin Total: 0.3 mg/dL (ref 0.0–1.2)
CO2: 22 mmol/L (ref 20–29)
Calcium: 9.3 mg/dL (ref 8.7–10.2)
Chloride: 102 mmol/L (ref 96–106)
Creatinine, Ser: 0.83 mg/dL (ref 0.57–1.00)
GFR calc Af Amer: 116 mL/min/{1.73_m2} (ref >59)
GFR calc non Af Amer: 100 mL/min/{1.73_m2} (ref >59)
Globulin, Total: 2.6 g/dL (ref 1.5–4.5)
Glucose: 123 mg/dL — ABNORMAL HIGH (ref 65–99)
Potassium: 4.6 mmol/L (ref 3.5–5.2)
Sodium: 139 mmol/L (ref 134–144)
Total Protein: 6.8 g/dL (ref 6.0–8.5)

## 2019-04-01 LAB — CBC WITH DIFFERENTIAL/PLATELET
Basophils Absolute: 0.1 10*3/uL (ref 0.0–0.2)
Basos: 1 %
EOS (ABSOLUTE): 0.1 10*3/uL (ref 0.0–0.4)
Eos: 2 %
Hematocrit: 39.1 % (ref 34.0–46.6)
Hemoglobin: 13.3 g/dL (ref 11.1–15.9)
Immature Grans (Abs): 0 10*3/uL (ref 0.0–0.1)
Immature Granulocytes: 0 %
Lymphocytes Absolute: 2.3 10*3/uL (ref 0.7–3.1)
Lymphs: 38 %
MCH: 29.3 pg (ref 26.6–33.0)
MCHC: 34 g/dL (ref 31.5–35.7)
MCV: 86 fL (ref 79–97)
Monocytes Absolute: 0.5 10*3/uL (ref 0.1–0.9)
Monocytes: 8 %
Neutrophils Absolute: 3.1 10*3/uL (ref 1.4–7.0)
Neutrophils: 51 %
Platelets: 292 10*3/uL (ref 150–450)
RBC: 4.54 x10E6/uL (ref 3.77–5.28)
RDW: 12.2 % (ref 11.7–15.4)
WBC: 6 10*3/uL (ref 3.4–10.8)

## 2019-04-01 LAB — THYROID PANEL WITH TSH
Free Thyroxine Index: 1.8 (ref 1.2–4.9)
T3 Uptake Ratio: 18 % — ABNORMAL LOW (ref 24–39)
T4, Total: 10.2 ug/dL (ref 4.5–12.0)
TSH: 2.34 u[IU]/mL (ref 0.450–4.500)

## 2019-04-04 ENCOUNTER — Other Ambulatory Visit: Payer: Self-pay

## 2019-04-04 ENCOUNTER — Other Ambulatory Visit
Admission: RE | Admit: 2019-04-04 | Discharge: 2019-04-04 | Disposition: A | Discharge status: Home / Self Care | Payer: BC Managed Care – PPO | Source: Ambulatory Visit | Attending: Unknown Physician Specialty | Admitting: Unknown Physician Specialty

## 2019-04-04 DIAGNOSIS — Z20828 Contact with and (suspected) exposure to other viral communicable diseases: Secondary | ICD-10-CM | POA: Insufficient documentation

## 2019-04-04 DIAGNOSIS — Z01812 Encounter for preprocedural laboratory examination: Secondary | ICD-10-CM | POA: Diagnosis not present

## 2019-04-04 DIAGNOSIS — Z20822 Contact with and (suspected) exposure to covid-19: Secondary | ICD-10-CM

## 2019-04-04 LAB — SARS CORONAVIRUS 2 (TAT 6-24 HRS): SARS Coronavirus 2: NEGATIVE

## 2019-04-07 ENCOUNTER — Encounter
Admission: RE | Disposition: A | Discharge status: Home / Self Care | Payer: Self-pay | Source: Home / Self Care | Attending: Unknown Physician Specialty

## 2019-04-07 ENCOUNTER — Ambulatory Visit
Admission: RE | Admit: 2019-04-07 | Discharge: 2019-04-07 | Disposition: A | Discharge status: Home / Self Care | Payer: BC Managed Care – PPO | Attending: Unknown Physician Specialty | Admitting: Unknown Physician Specialty

## 2019-04-07 ENCOUNTER — Ambulatory Visit: Payer: BC Managed Care – PPO | Admitting: Anesthesiology

## 2019-04-07 DIAGNOSIS — J3501 Chronic tonsillitis: Secondary | ICD-10-CM | POA: Insufficient documentation

## 2019-04-07 DIAGNOSIS — J039 Acute tonsillitis, unspecified: Secondary | ICD-10-CM | POA: Diagnosis not present

## 2019-04-07 DIAGNOSIS — J353 Hypertrophy of tonsils with hypertrophy of adenoids: Secondary | ICD-10-CM | POA: Diagnosis not present

## 2019-04-07 HISTORY — PX: ADENOIDECTOMY: SHX5191

## 2019-04-07 HISTORY — PX: TONSILLECTOMY: SHX5217

## 2019-04-07 LAB — POCT PREGNANCY, URINE: Preg Test, Ur: NEGATIVE

## 2019-04-07 SURGERY — TONSILLECTOMY
Anesthesia: General | Site: Throat | Laterality: Bilateral

## 2019-04-07 MED ORDER — ACETAMINOPHEN 10 MG/ML IV SOLN
1000.0000 mg | Freq: Once | INTRAVENOUS | Status: AC
  Administered 2019-04-07: 1000 mg via INTRAVENOUS

## 2019-04-07 MED ORDER — BUPIVACAINE HCL (PF) 0.5 % IJ SOLN
INTRAMUSCULAR | Status: DC | PRN
  Administered 2019-04-07: 8 mL

## 2019-04-07 MED ORDER — DEXAMETHASONE SODIUM PHOSPHATE 4 MG/ML IJ SOLN
INTRAMUSCULAR | Status: DC | PRN
  Administered 2019-04-07: 10 mg via INTRAVENOUS

## 2019-04-07 MED ORDER — PROMETHAZINE HCL 25 MG/ML IJ SOLN: 6.2500 mg | INTRAMUSCULAR | Status: DC | PRN

## 2019-04-07 MED ORDER — FENTANYL CITRATE (PF) 100 MCG/2ML IJ SOLN
25.0000 ug | INTRAMUSCULAR | Status: DC | PRN
  Administered 2019-04-07: 25 ug via INTRAVENOUS

## 2019-04-07 MED ORDER — LIDOCAINE HCL (CARDIAC) PF 100 MG/5ML IV SOSY
PREFILLED_SYRINGE | INTRAVENOUS | Status: DC | PRN
  Administered 2019-04-07: 50 mg via INTRAVENOUS

## 2019-04-07 MED ORDER — OXYCODONE HCL 5 MG/5ML PO SOLN
5.0000 mg | Freq: Once | ORAL | Status: AC | PRN
  Administered 2019-04-07: 5 mg via ORAL

## 2019-04-07 MED ORDER — SUCCINYLCHOLINE CHLORIDE 20 MG/ML IJ SOLN
INTRAMUSCULAR | Status: DC | PRN
  Administered 2019-04-07: 100 mg via INTRAVENOUS

## 2019-04-07 MED ORDER — FENTANYL CITRATE (PF) 100 MCG/2ML IJ SOLN
INTRAMUSCULAR | Status: DC | PRN
  Administered 2019-04-07: 100 ug via INTRAVENOUS
  Administered 2019-04-07: 25 ug via INTRAVENOUS

## 2019-04-07 MED ORDER — PROPOFOL 10 MG/ML IV BOLUS
INTRAVENOUS | Status: DC | PRN
  Administered 2019-04-07: 200 mg via INTRAVENOUS

## 2019-04-07 MED ORDER — GLYCOPYRROLATE 0.2 MG/ML IJ SOLN
INTRAMUSCULAR | Status: DC | PRN
  Administered 2019-04-07: 0.1 mg via INTRAVENOUS

## 2019-04-07 MED ORDER — HYDROCODONE-ACETAMINOPHEN 7.5-325 MG/15ML PO SOLN: 15.0000 mL | Freq: Four times a day (QID) | ORAL | 0 refills | Status: DC | PRN

## 2019-04-07 MED ORDER — MIDAZOLAM HCL 5 MG/5ML IJ SOLN
INTRAMUSCULAR | Status: DC | PRN
  Administered 2019-04-07: 2 mg via INTRAVENOUS

## 2019-04-07 MED ORDER — OXYCODONE HCL 5 MG PO TABS: 5.0000 mg | ORAL_TABLET | Freq: Once | ORAL | Status: AC | PRN

## 2019-04-07 MED ORDER — LACTATED RINGERS IV SOLN
INTRAVENOUS | Status: DC
  Administered 2019-04-07: 07:00:00 via INTRAVENOUS

## 2019-04-07 MED ORDER — MEPERIDINE HCL 25 MG/ML IJ SOLN: 6.2500 mg | INTRAMUSCULAR | Status: DC | PRN

## 2019-04-07 MED ORDER — ONDANSETRON HCL 4 MG/2ML IJ SOLN
INTRAMUSCULAR | Status: DC | PRN
  Administered 2019-04-07: 4 mg via INTRAVENOUS

## 2019-04-07 SURGICAL SUPPLY — 24 items
CANISTER SUCT 1200ML W/VALVE (MISCELLANEOUS) ×4 IMPLANT
CATH RUBBER RED 8F (CATHETERS) ×4 IMPLANT
COAG SUCT 10F 3.5MM HAND CTRL (MISCELLANEOUS) ×4 IMPLANT
DRAPE HEAD BAR (DRAPES) ×4 IMPLANT
ELECT CAUTERY BLADE TIP 2.5 (TIP) ×4
ELECT REM PT RETURN 9FT ADLT (ELECTROSURGICAL) ×4
ELECTRODE CAUTERY BLDE TIP 2.5 (TIP) ×2 IMPLANT
ELECTRODE REM PT RTRN 9FT ADLT (ELECTROSURGICAL) ×2 IMPLANT
GLOVE BIO SURGEON STRL SZ7.5 (GLOVE) ×6 IMPLANT
HANDLE SUCTION POOLE (INSTRUMENTS) ×2 IMPLANT
KIT TURNOVER KIT A (KITS) ×4 IMPLANT
NDL HYPO 25GX1X1/2 BEV (NEEDLE) ×2 IMPLANT
NEEDLE HYPO 25GX1X1/2 BEV (NEEDLE) IMPLANT
NS IRRIG 500ML POUR BTL (IV SOLUTION) ×4 IMPLANT
PACK TONSIL AND ADENOID CUSTOM (PACKS) ×4 IMPLANT
PENCIL ELECTRO HAND CTR (MISCELLANEOUS) ×4 IMPLANT
SOL ANTI-FOG 6CC FOG-OUT (MISCELLANEOUS) ×2 IMPLANT
SOL FOG-OUT ANTI-FOG 6CC (MISCELLANEOUS) ×2
SPONGE TONSIL .75 RFD DBL STRL (DISPOSABLE) ×4 IMPLANT
SPONGE TONSIL 7/8 RF SGL LF (GAUZE/BANDAGES/DRESSINGS) ×4 IMPLANT
STRAP BODY AND KNEE 60X3 (MISCELLANEOUS) ×4 IMPLANT
SUCTION POOLE HANDLE (INSTRUMENTS) ×4
SYR 10ML LL (SYRINGE) ×2 IMPLANT
SYR 5ML LL (SYRINGE) ×2 IMPLANT

## 2019-04-07 NOTE — Anesthesia Preprocedure Evaluation (Signed)
Anesthesia Evaluation  Patient identified by MRN, date of birth, ID band Patient awake    Reviewed: Allergy & Precautions, H&P , NPO status , Patient's Chart, lab work & pertinent test results, reviewed documented beta blocker date and time   Airway Mallampati: II  TM Distance: >3 FB Neck ROM: full    Dental no notable dental hx.    Pulmonary neg pulmonary ROS,    Pulmonary exam normal breath sounds clear to auscultation       Cardiovascular Exercise Tolerance: Good negative cardio ROS   Rhythm:regular Rate:Normal     Neuro/Psych negative neurological ROS  negative psych ROS   GI/Hepatic negative GI ROS, Neg liver ROS,   Endo/Other  negative endocrine ROS  Renal/GU negative Renal ROS  negative genitourinary   Musculoskeletal   Abdominal   Peds  Hematology negative hematology ROS (+)   Anesthesia Other Findings   Reproductive/Obstetrics negative OB ROS                             Anesthesia Physical Anesthesia Plan  ASA: I  Anesthesia Plan: General   Post-op Pain Management:    Induction:   PONV Risk Score and Plan:   Airway Management Planned:   Additional Equipment:   Intra-op Plan:   Post-operative Plan:   Informed Consent: I have reviewed the patients History and Physical, chart, labs and discussed the procedure including the risks, benefits and alternatives for the proposed anesthesia with the patient or authorized representative who has indicated his/her understanding and acceptance.   Dental Advisory Given  Plan Discussed with: CRNA  Anesthesia Plan Comments:         Anesthesia Quick Evaluation  

## 2019-04-07 NOTE — Op Note (Signed)
PREOPERATIVE DIAGNOSIS:  Chronic Tonsillitis  POSTOPERATIVE DIAGNOSIS: Same  OPERATION:  Tonsillectomy and adenoidectomy.  SURGEON:  Roena Malady, MD  ANESTHESIA:  General endotracheal.  OPERATIVE FINDINGS:  Large tonsils and adenoids.  DESCRIPTION OF THE PROCEDURE:  Ashley Blevins was identified in the holding area and taken to the operating room and placed in the supine position.  After general endotracheal anesthesia, the table was turned 45 degrees and the patient was draped in the usual fashion for a tonsillectomy.  A mouth gag was inserted into the oral cavity and examination of the oropharynx showed the uvula was non-bifid.  There was no evidence of submucous cleft to the palate.  There were large tonsils.  A red rubber catheter was placed through the nostril.  Examination of the nasopharynx showed large obstructing adenoids.  Under indirect vision with the mirror, an adenotome was placed in the nasopharynx.  The adenoids were curetted free.  Reinspection with a mirror showed excellent removal of the adenoid.  Nasopharyngeal packs were then placed.  The operation then turned to the tonsillectomy.  Beginning on the left-hand side a tenaculum was used to grasp the tonsil and the Bovie cautery was used to dissect it free from the fossa.  In a similar fashion, the right tonsil was removed.  Meticulous hemostasis was achieved using the Bovie cautery.  With both tonsils removed and no active bleeding, the nasopharyngeal packs were removed.  Suction cautery was then used to cauterize the nasopharyngeal bed to prevent bleeding.  The red rubber catheter was removed with no active bleeding.  0.5% plain Marcaine was used to inject the anterior and posterior tonsillar pillars bilaterally.  A total of 83ml was used.  The patient tolerated the procedure well and was awakened in the operating room and taken to the recovery room in stable condition.   CULTURES:  None.  SPECIMENS:  Tonsils and  adenoids.  ESTIMATED BLOOD LOSS:  Less than 20 ml.  Roena Malady  04/07/2019  8:13 AM

## 2019-04-07 NOTE — Anesthesia Procedure Notes (Signed)
Procedure Name: Intubation Date/Time: 04/07/2019 7:47 AM Performed by: Mayme Genta, CRNA Pre-anesthesia Checklist: Patient identified, Emergency Drugs available, Suction available, Patient being monitored and Timeout performed Patient Re-evaluated:Patient Re-evaluated prior to induction Oxygen Delivery Method: Circle system utilized Preoxygenation: Pre-oxygenation with 100% oxygen Induction Type: IV induction Ventilation: Mask ventilation without difficulty Laryngoscope Size: Miller and 2 Grade View: Grade I Tube type: Oral Rae Tube size: 7.0 mm Number of attempts: 1 Placement Confirmation: ETT inserted through vocal cords under direct vision,  positive ETCO2 and breath sounds checked- equal and bilateral Tube secured with: Tape Dental Injury: Teeth and Oropharynx as per pre-operative assessment

## 2019-04-07 NOTE — Anesthesia Postprocedure Evaluation (Signed)
Anesthesia Post Note  Patient: Ashley Blevins  Procedure(s) Performed: TONSILLECTOMY (Bilateral Throat) ADENOIDECTOMY (Bilateral Nose)  Patient location during evaluation: PACU Anesthesia Type: General Level of consciousness: awake and alert Pain management: pain level controlled Vital Signs Assessment: post-procedure vital signs reviewed and stable Respiratory status: spontaneous breathing, nonlabored ventilation, respiratory function stable and patient connected to nasal cannula oxygen Cardiovascular status: blood pressure returned to baseline and stable Postop Assessment: no apparent nausea or vomiting Anesthetic complications: no    SCOURAS, NICOLE ELAINE

## 2019-04-07 NOTE — Transfer of Care (Signed)
Immediate Anesthesia Transfer of Care Note  Patient: Ashley Blevins  Procedure(s) Performed: TONSILLECTOMY (Bilateral Throat) ADENOIDECTOMY (Bilateral Nose)  Patient Location: PACU  Anesthesia Type: General  Level of Consciousness: awake, alert  and patient cooperative  Airway and Oxygen Therapy: Patient Spontanous Breathing and Patient connected to supplemental oxygen  Post-op Assessment: Post-op Vital signs reviewed, Patient's Cardiovascular Status Stable, Respiratory Function Stable, Patent Airway and No signs of Nausea or vomiting  Post-op Vital Signs: Reviewed and stable  Complications: No apparent anesthesia complications

## 2019-04-07 NOTE — H&P (Signed)
The patient's history has been reviewed, patient examined, no change in status, stable for surgery.  Questions were answered to the patients satisfaction.  

## 2019-04-08 ENCOUNTER — Other Ambulatory Visit: Payer: Self-pay | Admitting: Obstetrics and Gynecology

## 2019-04-10 ENCOUNTER — Encounter: Payer: Self-pay | Admitting: Unknown Physician Specialty

## 2019-04-11 LAB — SURGICAL PATHOLOGY

## 2019-04-16 ENCOUNTER — Other Ambulatory Visit: Payer: Self-pay | Admitting: Obstetrics and Gynecology

## 2019-05-03 ENCOUNTER — Telehealth: Payer: Self-pay

## 2019-05-03 NOTE — Telephone Encounter (Signed)
Pts mom( Amy) and pt would like her  to start wellbutrin.   Pt d/c zoloft end of June d/t 30#wt gain.  She was [facility] by her [facility] yesterday and she thinks the pt needs something. She suggesed Wellbutrin 150 XL.   Advised pt to have records from therapist faxed to clinic(fax number given) for [place] to review. [place] will decide if she needs a FTF visit, telephone visit, or send meds in and have her f/u at a later date.   Pharmacy- [facility]  Pls advise.

## 2019-05-03 NOTE — Telephone Encounter (Signed)
I would like to receive her records, and have a visit with her to discuss further. Trying to do a televisit with depression screens is tedious. If you can contact her to see what her primary symptoms are and how bad they are, we can see if this is something we can get her in for quickly, or if I just need to start her on something and worry about getting her in later.    Dr. Marcelline Mates

## 2019-05-07 ENCOUNTER — Other Ambulatory Visit: Payer: Self-pay | Admitting: Obstetrics and Gynecology

## 2019-05-09 NOTE — Telephone Encounter (Signed)
Pt called no answer LM via VM to call the office to schedule an appointment with AC.  

## 2019-05-10 NOTE — Telephone Encounter (Signed)
Pt called to get more information about her call to the office and schedule an appointment to see ac.

## 2019-05-12 ENCOUNTER — Other Ambulatory Visit: Payer: Self-pay

## 2019-05-12 ENCOUNTER — Encounter: Payer: Self-pay | Admitting: Obstetrics and Gynecology

## 2019-05-12 ENCOUNTER — Ambulatory Visit: Payer: BC Managed Care – PPO | Admitting: Obstetrics and Gynecology

## 2019-05-12 VITALS — BP 106/71 | HR 90 | Ht 64.0 in | Wt 134.5 lb

## 2019-05-12 DIAGNOSIS — T887XXA Unspecified adverse effect of drug or medicament, initial encounter: Secondary | ICD-10-CM | POA: Diagnosis not present

## 2019-05-12 DIAGNOSIS — Z8659 Personal history of other mental and behavioral disorders: Secondary | ICD-10-CM | POA: Insufficient documentation

## 2019-05-12 DIAGNOSIS — R4586 Emotional lability: Secondary | ICD-10-CM | POA: Diagnosis not present

## 2019-05-12 DIAGNOSIS — R4589 Other symptoms and signs involving emotional state: Secondary | ICD-10-CM

## 2019-05-12 DIAGNOSIS — F329 Major depressive disorder, single episode, unspecified: Secondary | ICD-10-CM

## 2019-05-12 MED ORDER — BUPROPION HCL ER (XL) 150 MG PO TB24: 150.0000 mg | ORAL_TABLET | Freq: Every day | ORAL | 2 refills | Status: DC

## 2019-05-12 NOTE — Progress Notes (Signed)
Pt present due to stopping her Zoloft 50 mg about 3 months ago thinking that she did not need the medication anymore. Pt stated that she started noticing depression symptoms about 3-4 ago. Pt sated noticing being really anger for no reason or just wanting to be by herself and not around others. PHQ-9=8.

## 2019-05-12 NOTE — Progress Notes (Signed)
    GYNECOLOGY PROGRESS NOTE  Subjective:    Patient ID: Ashley Blevins, female    DOB: 1996-03-10, 23 y.o.   MRN: [id number]  HPI  Patient is a 23 y.o. G0P0000 female who presents for management of her "depression" symptoms.  She states that she has had extreme mood changes (mostly anger with outbursts), and has found her self isolating. Symptoms have been ongoing for the past 3-4 weeks.  Was previously on Zoloft for anxiety (has been on this medication for  3 years), but discontinued several months ago as she felt that she dd not need the medication anymore, and did not like the side effects of weight gain (~ 20-25 lbs over the past few years).  She is also being seen by a therapist, who suggested that she try Wellbutrin.  PHQ-9 score is 8, GAD score is 4.   The following portions of the patient's history were reviewed and updated as appropriate: allergies, current medications, past family history, past medical history, past social history, past surgical history and problem list.  Review of Systems Pertinent items noted in HPI and remainder of comprehensive ROS otherwise negative.   Objective:   Blood pressure 106/71, pulse 90, height 5\' 4"  (1.626 m), weight 134 lb 8 oz (61 kg), last menstrual period 04/16/2019. General appearance: alert and no distress Psychiatric: Normal appearance, mood, and affect today. Normal speech and thought content.  Neurologic: Grossly intact   Assessment:   Recurrent major depressive disorder, in partial remission (HCC) Mood changes Medication side effect  Plan:   - Based on patient's scores, symptoms are mild, however feel more significant to the patient.  In light of symptoms and at suggestion of her therapist, will initiate on Wellbutrin XL 150 mg. Discussed risks and benefits of medication. Advised that effects of the medication may be noted until 2-4 weeks after initiation.  - To f/u with her her therapist Monday, and again in 2 weeks.  I will also  f/u with patient in 4-6 weeks to assess if titration of medication is indicated or if any side effects present.    Rubie Maid, MD [facility]

## 2019-05-12 NOTE — Patient Instructions (Signed)

## 2019-05-13 DIAGNOSIS — Z23 Encounter for immunization: Secondary | ICD-10-CM | POA: Diagnosis not present

## 2019-05-29 ENCOUNTER — Telehealth: Payer: Self-pay | Admitting: Obstetrics and Gynecology

## 2019-05-29 NOTE — Telephone Encounter (Signed)
The patient called and stated that she saw her therapist and was advised to tell her provider Dr. Marcelline Mates that her prescription buPROPion (WELLBUTRIN XL) 150 MG 24 hr tablet [36775] should be increased to 300 mg. The patient is requesting a call back. Please advise.

## 2019-05-31 ENCOUNTER — Telehealth: Payer: Self-pay | Admitting: Obstetrics and Gynecology

## 2019-05-31 NOTE — Telephone Encounter (Signed)
Please advise on increase of medication.

## 2019-05-31 NOTE — Telephone Encounter (Signed)
Pt message was accidentally sent to [place] pt requesting call back for previous message sent on 05/29/19   "The patient called and stated that she saw her therapist and was advised to tell her provider Dr. Marcelline Mates that her prescription buPROPion (WELLBUTRIN XL) 150 MG 24 hr tablet [36775] should be increased to 300 mg. The patient is requesting a call back. Please advise."

## 2019-06-01 MED ORDER — BUPROPION HCL ER (XL) 300 MG PO TB24: 300.0000 mg | ORAL_TABLET | Freq: Every day | ORAL | 1 refills | Status: DC

## 2019-06-01 NOTE — Telephone Encounter (Signed)
We can send in a new prescription for the 300 mg daily dosing.  In the future, if her therapist has further recommendations, I would love for her to contact me directly via email or by phone (office) to be more collaborative and in sync with her care.

## 2019-06-01 NOTE — Telephone Encounter (Signed)
Med rx.  Pt aware per vm that therapist will need to contact [place] in the future with recommendations.

## 2019-06-03 ENCOUNTER — Other Ambulatory Visit: Payer: Self-pay | Admitting: Obstetrics and Gynecology

## 2019-06-09 DIAGNOSIS — Z20828 Contact with and (suspected) exposure to other viral communicable diseases: Secondary | ICD-10-CM | POA: Diagnosis not present

## 2019-06-13 DIAGNOSIS — Z20828 Contact with and (suspected) exposure to other viral communicable diseases: Secondary | ICD-10-CM | POA: Diagnosis not present

## 2019-06-21 ENCOUNTER — Telehealth: Payer: Self-pay | Admitting: Obstetrics and Gynecology

## 2019-06-21 NOTE — Telephone Encounter (Signed)
Pt called and is wanting to know if her appointment on Friday with Dr. Marcelline Mates can be a tele-visit. Pt is requesting a call back. Please advise.

## 2019-06-22 NOTE — Telephone Encounter (Signed)
Pt called back and is aware that it is okay for her to have a tele-visit.

## 2019-06-23 ENCOUNTER — Ambulatory Visit (INDEPENDENT_AMBULATORY_CARE_PROVIDER_SITE_OTHER): Payer: BC Managed Care – PPO | Admitting: Obstetrics and Gynecology

## 2019-06-23 ENCOUNTER — Telehealth: Payer: Self-pay | Admitting: Obstetrics and Gynecology

## 2019-06-23 ENCOUNTER — Other Ambulatory Visit: Payer: Self-pay

## 2019-06-23 ENCOUNTER — Encounter: Payer: Self-pay | Admitting: Obstetrics and Gynecology

## 2019-06-23 VITALS — Ht 64.0 in | Wt 128.0 lb

## 2019-06-23 DIAGNOSIS — F3289 Other specified depressive episodes: Secondary | ICD-10-CM | POA: Diagnosis not present

## 2019-06-23 NOTE — Progress Notes (Signed)
Televisit- pt called by front staff and transferred to nurse. Reviewed medications and updated information. Pt's visit is for follow up for depression medication. Pt stated that she is taking the medication as prescribed and has noticed a lot of changes in her mood since. PHQ-9= 4.

## 2019-06-23 NOTE — Progress Notes (Signed)
Virtual Visit via Telephone Note  I connected with Ashley Blevins on 06/23/19 at  1:30 PM EDT by telephone and verified that I am speaking with the correct person using two identifiers.   I discussed the limitations, risks, security and privacy concerns of performing an evaluation and management service by telephone and the availability of in person appointments. I also discussed with the patient that there may be a patient responsible charge related to this service. The patient expressed understanding and agreed to proceed.   History of Present Illness: Ashley Blevins is a 23 y.o. G0P0000 female who presents via televisit for follow up of her mood symptoms. She has increased her Wellbutrin from 150 mg to 300 mg last visit. Patient also seeing a therapist for her mood symptoms (every other week). Notes that she has noted improvement in her symptoms. Denies any side effect.    Observations/Objective: Weight: 128 lbs.   Height 5'4.    Assessment and Plan: Depression/mood changes improved on new dose of Wellbutrin. Will continue medication. No side effects noted. Also encouraged to continue close f/u with therapist. Return to clinic for any scheduled appointments or for any gynecologic concerns as needed.    Follow Up Instructions:  I discussed the assessment and treatment plan with the patient. The patient was provided an opportunity to ask questions and all were answered. The patient agreed with the plan and demonstrated an understanding of the instructions.   The patient was advised to call back or seek an in-person evaluation if the symptoms worsen or if the condition fails to improve as anticipated.  I provided 7 minutes of non-face-to-face time during this encounter.   Rubie Maid, MD [facility]

## 2019-06-30 ENCOUNTER — Other Ambulatory Visit: Payer: Self-pay | Admitting: Obstetrics and Gynecology

## 2019-07-13 ENCOUNTER — Telehealth: Payer: Self-pay

## 2019-07-13 MED ORDER — FLUOXETINE HCL 10 MG PO CAPS: 10.0000 mg | ORAL_CAPSULE | Freq: Every day | ORAL | 11 refills | Status: DC

## 2019-07-13 NOTE — Telephone Encounter (Signed)
Pt called and informed that medication was sent to her pharmacy.

## 2019-07-16 DIAGNOSIS — Z20828 Contact with and (suspected) exposure to other viral communicable diseases: Secondary | ICD-10-CM | POA: Diagnosis not present

## 2019-08-27 ENCOUNTER — Other Ambulatory Visit: Payer: Self-pay | Admitting: Obstetrics and Gynecology

## 2019-09-23 ENCOUNTER — Other Ambulatory Visit: Payer: Self-pay | Admitting: Obstetrics and Gynecology

## 2019-09-23 NOTE — Telephone Encounter (Signed)
Needs annual exam prior to next refill.  

## 2019-09-25 ENCOUNTER — Telehealth: Payer: Self-pay

## 2019-09-25 NOTE — Telephone Encounter (Signed)
Pt see telephone encounter notes.

## 2019-09-25 NOTE — Telephone Encounter (Signed)
Pt called no answer LM via VM to call the office to schedule an appointment with [facility] for an annual exam. Pt is aware via VM that no more refills of medication will be given until she has an annual exam.

## 2019-10-04 NOTE — Telephone Encounter (Signed)
Pt called no answer LM via VM to call the office to schedule an appointment for annual exam with [facility] before anymore refills will be given.

## 2019-11-11 ENCOUNTER — Ambulatory Visit: Payer: BC Managed Care – PPO | Attending: Internal Medicine

## 2019-11-11 DIAGNOSIS — Z23 Encounter for immunization: Secondary | ICD-10-CM

## 2019-11-11 NOTE — Progress Notes (Signed)
   Covid-19 Vaccination Clinic  Name:  Ashley Blevins    MRN: [id number] DOB: March 29, 1996  11/11/2019  [name] was observed post Covid-19 immunization for 15 minutes without incident. She was provided with Vaccine Information Sheet and instruction to access the V-Safe system.   [name] was instructed to call 911 with any severe reactions post vaccine: Marland Kitchen Difficulty breathing  . Swelling of face and throat  . A fast heartbeat  . A bad rash all over body  . Dizziness and weakness   Immunizations Administered    Name Date Dose VIS Date Route   Pfizer COVID-19 Vaccine 11/11/2019 10:14 AM 0.3 mL 08/04/2019 Intramuscular   Manufacturer: [employer], [employer]   Lot: [id number]   NDC: [address]-6

## 2019-12-05 ENCOUNTER — Ambulatory Visit: Payer: BC Managed Care – PPO | Attending: Internal Medicine

## 2019-12-05 DIAGNOSIS — Z23 Encounter for immunization: Secondary | ICD-10-CM

## 2019-12-05 NOTE — Progress Notes (Signed)
.     Covid-19 Vaccination Clinic  Name:  Ashley Blevins    MRN: [id number] DOB: [birth year]  12/05/2019  [name] was observed post Covid-19 immunization for 15 minutes without incident. She was provided with Vaccine Information Sheet and instruction to access the V-Safe system.   [name] was instructed to call 911 with any severe reactions post vaccine: Marland Kitchen Difficulty breathing  . Swelling of face and throat  . A fast heartbeat  . A bad rash all over body  . Dizziness and weakness   Immunizations Administered    Name Date Dose VIS Date Route   Pfizer COVID-19 Vaccine 12/05/2019  8:16 AM 0.3 mL 08/04/2019 Intramuscular   Manufacturer: [employer], [employer]   Lot: [id number]   NDC: [address]-3

## 2019-12-15 ENCOUNTER — Other Ambulatory Visit: Payer: Self-pay | Admitting: Obstetrics and Gynecology

## 2020-03-14 ENCOUNTER — Other Ambulatory Visit: Payer: Self-pay | Admitting: Obstetrics and Gynecology

## 2020-03-15 NOTE — Telephone Encounter (Signed)
Pt called in and stated that she needs a refill on her zoloft. The pt uses [facility]. Please advise

## 2020-03-27 ENCOUNTER — Ambulatory Visit: Payer: BC Managed Care – PPO | Admitting: Dermatology

## 2020-05-29 ENCOUNTER — Encounter: Payer: BC Managed Care – PPO | Admitting: Obstetrics and Gynecology

## 2020-07-01 ENCOUNTER — Other Ambulatory Visit: Payer: Self-pay

## 2020-07-02 ENCOUNTER — Other Ambulatory Visit: Payer: Self-pay

## 2020-07-02 NOTE — Telephone Encounter (Signed)
Patient called in stating that she needs this prescription sent to [facility] on [address]  in [place].   Could you please advise?

## 2020-07-03 MED ORDER — SERTRALINE HCL 50 MG PO TABS: 50.0000 mg | ORAL_TABLET | Freq: Every day | ORAL | 0 refills | Status: DC

## 2020-07-03 NOTE — Telephone Encounter (Signed)
Pt had an annual exam scheduled for 05/29/2020 she cancelled and rescheduled for 08/13/2020. Zoloft was refilled on 03/15/2020 with 90 tablets 0 refills to cover her until her annual exam. Would you like to do a televisit with pt or refill the medication until her appointment on 08/13/2020? Please advise. Thanks [employer]

## 2020-07-03 NOTE — Telephone Encounter (Signed)
Further refills will be given at appointment.

## 2020-07-03 NOTE — Telephone Encounter (Signed)
We can refill her for a month, but if she reschedules again, she will need to have at least a televisit before any further refills are given.

## 2020-07-09 ENCOUNTER — Telehealth: Payer: Self-pay

## 2020-07-09 NOTE — Telephone Encounter (Signed)
Ashley Blevins called in and stated that she is requesting a call back from Ashley Blevins. Ashley Blevins said that she is calling in on behave of her daughter Ashley Blevins. The daughter is in [place] and the daughter is out of her meds. [facility] [place] [facility]. The daughter uses [facility]. The mother is calling in and asking what to do, the mom has already picked up the refill here at [facility] and she had its sent to [place] overnight. I told Ashley Blevins I will send a message. Please advise  

## 2020-08-12 NOTE — Patient Instructions (Addendum)
Preventive Care 21-24 Years Old, Female Preventive care refers to visits with your health care provider and lifestyle choices that can promote health and wellness. This includes:  A yearly physical exam. This may also be called an annual well check.  Regular dental visits and eye exams.  Immunizations.  Screening for certain conditions.  Healthy lifestyle choices, such as eating a healthy diet, getting regular exercise, not using drugs or products that contain nicotine and tobacco, and limiting alcohol use. What can I expect for my preventive care visit? Physical exam Your health care provider will check your:  Height and weight. This may be used to calculate body mass index (BMI), which tells if you are at a healthy weight.  Heart rate and blood pressure.  Skin for abnormal spots. Counseling Your health care provider may ask you questions about your:  Alcohol, tobacco, and drug use.  Emotional well-being.  Home and relationship well-being.  Sexual activity.  Eating habits.  Work and work environment.  Method of birth control.  Menstrual cycle.  Pregnancy history. What immunizations do I need?  Influenza (flu) vaccine  This is recommended every year. Tetanus, diphtheria, and pertussis (Tdap) vaccine  You may need a Td booster every 10 years. Varicella (chickenpox) vaccine  You may need this if you have not been vaccinated. Human papillomavirus (HPV) vaccine  If recommended by your health care provider, you may need three doses over 6 months. Measles, mumps, and rubella (MMR) vaccine  You may need at least one dose of MMR. You may also need a second dose. Meningococcal conjugate (MenACWY) vaccine  One dose is recommended if you are age 19-21 years and a first-year college student living in a residence hall, or if you have one of several medical conditions. You may also need additional booster doses. Pneumococcal conjugate (PCV13) vaccine  You may need  this if you have certain conditions and were not previously vaccinated. Pneumococcal polysaccharide (PPSV23) vaccine  You may need one or two doses if you smoke cigarettes or if you have certain conditions. Hepatitis A vaccine  You may need this if you have certain conditions or if you travel or work in places where you may be exposed to hepatitis A. Hepatitis B vaccine  You may need this if you have certain conditions or if you travel or work in places where you may be exposed to hepatitis B. Haemophilus influenzae type b (Hib) vaccine  You may need this if you have certain conditions. You may receive vaccines as individual doses or as more than one vaccine together in one shot (combination vaccines). Talk with your health care provider about the risks and benefits of combination vaccines. What tests do I need?  Blood tests  Lipid and cholesterol levels. These may be checked every 5 years starting at age 20.  Hepatitis C test.  Hepatitis B test. Screening  Diabetes screening. This is done by checking your blood sugar (glucose) after you have not eaten for a while (fasting).  Sexually transmitted disease (STD) testing.  BRCA-related cancer screening. This may be done if you have a family history of breast, ovarian, tubal, or peritoneal cancers.  Pelvic exam and Pap test. This may be done every 3 years starting at age 21. Starting at age 30, this may be done every 5 years if you have a Pap test in combination with an HPV test. Talk with your health care provider about your test results, treatment options, and if necessary, the need for more tests.   Follow these instructions at home: Eating and drinking   Eat a diet that includes fresh fruits and vegetables, whole grains, lean protein, and low-fat dairy.  Take vitamin and mineral supplements as recommended by your health care provider.  Do not drink alcohol if: ? Your health care provider tells you not to drink. ? You are  pregnant, may be pregnant, or are planning to become pregnant.  If you drink alcohol: ? Limit how much you have to 0-1 drink a day. ? Be aware of how much alcohol is in your drink. In the [place] one drink equals one 12 oz bottle of beer (355 mL), one 5 oz glass of wine (148 mL), or one 1 oz glass of hard liquor (44 mL). Lifestyle  Take daily care of your teeth and gums.  Stay active. Exercise for at least 30 minutes on 5 or more days each week.  Do not use any products that contain nicotine or tobacco, such as cigarettes, e-cigarettes, and chewing tobacco. If you need help quitting, ask your health care provider.  If you are sexually active, practice safe sex. Use a condom or other form of birth control (contraception) in order to prevent pregnancy and STIs (sexually transmitted infections). If you plan to become pregnant, see your health care provider for a preconception visit. What's next?  Visit your health care provider once a year for a well check visit.  Ask your health care provider how often you should have your eyes and teeth checked.  Stay up to date on all vaccines. This information is not intended to replace advice given to you by your health care provider. Make sure you discuss any questions you have with your health care provider. Document Revised: 04/21/2018 Document Reviewed: 04/21/2018 Elsevier Patient Education  2020 [place] Breast self-awareness is knowing how your breasts look and feel. Doing breast self-awareness is important. It allows you to catch a breast problem early while it is still small and can be treated. All women should do breast self-awareness, including women who have had breast implants. Tell your doctor if you notice a change in your breasts. What you need:  A mirror.  A well-lit room. How to do a breast self-exam A breast self-exam is one way to learn what is normal for your breasts and to check for changes. To do a  breast self-exam: Look for changes  1. Take off all the clothes above your waist. 2. Stand in front of a mirror in a room with good lighting. 3. Put your hands on your hips. 4. Push your hands down. 5. Look at your breasts and nipples in the mirror to see if one breast or nipple looks different from the other. Check to see if: ? The shape of one breast is different. ? The size of one breast is different. ? There are wrinkles, dips, and bumps in one breast and not the other. 6. Look at each breast for changes in the skin, such as: ? Redness. ? Scaly areas. 7. Look for changes in your nipples, such as: ? Liquid around the nipples. ? Bleeding. ? Dimpling. ? Redness. ? A change in where the nipples are. Feel for changes  1. Lie on your back on the floor. 2. Feel each breast. To do this, follow these steps: ? Pick a breast to feel. ? Put the arm closest to that breast above your head. ? Use your other arm to feel the nipple area of your breast.  Feel the area with the pads of your three middle fingers by making small circles with your fingers. For the first circle, press lightly. For the second circle, press harder. For the third circle, press even harder. ? Keep making circles with your fingers at the different pressures as you move down your breast. Stop when you feel your ribs. ? Move your fingers a little toward the center of your body. ? Start making circles with your fingers again, this time going up until you reach your collarbone. ? Keep making up-and-down circles until you reach your armpit. Remember to keep using the three pressures. ? Feel the other breast in the same way. 3. Sit or stand in the tub or shower. 4. With soapy water on your skin, feel each breast the same way you did in step 2 when you were lying on the floor. Write down what you find Writing down what you find can help you remember what to tell your doctor. Write down:  What is normal for each breast.  Any  changes you find in each breast, including: ? The kind of changes you find. ? Whether you have pain. ? Size and location of any lumps.  When you last had your menstrual period. General tips  Check your breasts every month.  If you are breastfeeding, the best time to check your breasts is after you feed your baby or after you use a breast pump.  If you get menstrual periods, the best time to check your breasts is 5-7 days after your menstrual period is over.  With time, you will become comfortable with the self-exam, and you will begin to know if there are changes in your breasts. Contact a doctor if you:  See a change in the shape or size of your breasts or nipples.  See a change in the skin of your breast or nipples, such as red or scaly skin.  Have fluid coming from your nipples that is not normal.  Find a lump or thick area that was not there before.  Have pain in your breasts.  Have any concerns about your breast health. Summary  Breast self-awareness includes looking for changes in your breasts, as well as feeling for changes within your breasts.  Breast self-awareness should be done in front of a mirror in a well-lit room.  You should check your breasts every month. If you get menstrual periods, the best time to check your breasts is 5-7 days after your menstrual period is over.  Let your doctor know of any changes you see in your breasts, including changes in size, changes on the skin, pain or tenderness, or fluid from your nipples that is not normal. This information is not intended to replace advice given to you by your health care provider. Make sure you discuss any questions you have with your health care provider. Document Revised: 03/29/2018 Document Reviewed: 03/29/2018 Elsevier Patient Education  2020 Elsevier Inc.   HPV (Human Papillomavirus) Vaccine: What You Need to Know 1. Why get vaccinated? HPV (Human papillomavirus) vaccine can prevent infection with  some types of human papillomavirus. HPV infections can cause certain types of cancers including:  cervical, vaginal and vulvar cancers in women,  penile cancer in men, and  anal cancers in both men and women. HPV vaccine prevents infection from the HPV types that cause over 90% of these cancers. HPV is spread through intimate skin-to-skin or sexual contact. HPV infections are so common that nearly all men and women   will get at least one type of HPV at some time in their lives. Most HPV infections go away by themselves within 2 years. But sometimes HPV infections will last longer and can cause cancers later in life. 2. HPV vaccine HPV vaccine is routinely recommended for adolescents at 11 or 24 years of age to ensure they are protected before they are exposed to the virus. HPV vaccine may be given beginning at age 9 years, and as late as age 45 years. Most people older than 26 years will not benefit from HPV vaccination. Talk with your health care provider if you want more information. Most children who get the first dose before 15 years of age need 2 doses of HPV vaccine. Anyone who gets the first dose on or after 24 years of age, and younger people with certain immunocompromising conditions, need 3 doses. Your health care provider can give you more information. HPV vaccine may be given at the same time as other vaccines. 3. Talk with your health care provider Tell your vaccine provider if the person getting the vaccine:  Has had an allergic reaction after a previous dose of HPV vaccine, or has any severe, life-threatening allergies.  Is pregnant. In some cases, your health care provider may decide to postpone HPV vaccination to a future visit. People with minor illnesses, such as a cold, may be vaccinated. People who are moderately or severely ill should usually wait until they recover before getting HPV vaccine. Your health care provider can give you more information. 4. Risks of a vaccine  reaction  Soreness, redness, or swelling where the shot is given can happen after HPV vaccine.  Fever or headache can happen after HPV vaccine. People sometimes faint after medical procedures, including vaccination. Tell your provider if you feel dizzy or have vision changes or ringing in the ears. As with any medicine, there is a very remote chance of a vaccine causing a severe allergic reaction, other serious injury, or death. 5. What if there is a serious problem? An allergic reaction could occur after the vaccinated person leaves the clinic. If you see signs of a severe allergic reaction (hives, swelling of the face and throat, difficulty breathing, a fast heartbeat, dizziness, or weakness), call 9-1-1 and get the person to the nearest hospital. For other signs that concern you, call your health care provider. Adverse reactions should be reported to the Vaccine Adverse Event Reporting System (VAERS). Your health care provider will usually file this report, or you can do it yourself. Visit the VAERS website at www.vaers.hhs.gov or call 1-800-822-7967. VAERS is only for reporting reactions, and VAERS staff do not give medical advice. 6. The National Vaccine Injury Compensation Program The National Vaccine Injury Compensation Program (VICP) is a federal program that was created to compensate people who may have been injured by certain vaccines. Visit the VICP website at www.hrsa.gov/vaccinecompensation or call 1-800-338-2382 to learn about the program and about filing a claim. There is a time limit to file a claim for compensation. 7. How can I learn more?  Ask your health care provider.  Call your local or state health department.  Contact the Centers for Disease Control and Prevention (CDC): ? Call 1-800-232-4636 (1-800-CDC-INFO) or ? Visit CDC's website at www.cdc.gov/vaccines Vaccine Information Statement HPV Vaccine (06/22/2018) This information is not intended to replace advice given to  you by your health care provider. Make sure you discuss any questions you have with your health care provider. Document Revised: 11/29/2018 Document Reviewed:   03/22/2018 Elsevier Patient Education  2020 Elsevier Inc.   

## 2020-08-12 NOTE — Progress Notes (Signed)
Annual Exam-Pt stated that she was doing well. Would like to discuss other options of birth control like IUDs.

## 2020-08-13 ENCOUNTER — Other Ambulatory Visit: Payer: Self-pay

## 2020-08-13 ENCOUNTER — Encounter: Payer: Self-pay | Admitting: Obstetrics and Gynecology

## 2020-08-13 ENCOUNTER — Ambulatory Visit (INDEPENDENT_AMBULATORY_CARE_PROVIDER_SITE_OTHER): Payer: BC Managed Care – PPO | Admitting: Obstetrics and Gynecology

## 2020-08-13 VITALS — BP 124/85 | HR 109 | Ht 64.0 in | Wt 135.2 lb

## 2020-08-13 DIAGNOSIS — Z01419 Encounter for gynecological examination (general) (routine) without abnormal findings: Secondary | ICD-10-CM

## 2020-08-13 DIAGNOSIS — Z3009 Encounter for other general counseling and advice on contraception: Secondary | ICD-10-CM | POA: Diagnosis not present

## 2020-08-13 DIAGNOSIS — Z8659 Personal history of other mental and behavioral disorders: Secondary | ICD-10-CM | POA: Diagnosis not present

## 2020-08-13 NOTE — Progress Notes (Signed)
GYNECOLOGY ANNUAL PHYSICAL EXAM PROGRESS NOTE  Subjective:    Ashley Blevins is a 24 y.o. G0P0000 female who presents for an annual exam. The patient has no major complaints today. The patient is sexually active. The patient wears seatbelts: yes. The patient participates in regular exercise: yes. Has the patient ever been transfused or tattooed?: yes. The patient reports that there is not domestic violence in her life.   The patient would like to discuss contraception options (specifically IUD).   Gynecologic History  Menarche age:  Patient's last menstrual period was 07/20/2020.  Contraception: OCP (estrogen/progesterone) History of STI's: Denies Last Pap: 08/26/2017. Results were: normal.  Denies h/o abnormal pap smears.    Upstream - 08/13/20 1510      Pregnancy Intention Screening   Does the patient want to become pregnant in the next year? No    Does the patient's partner want to become pregnant in the next year? No    Would the patient like to discuss contraceptive options today? Yes      Contraception Wrap Up   Current Method Oral Contraceptive          The pregnancy intention screening data noted above was reviewed. Potential methods of contraception were discussed. The patient elected to proceed with Oral Contraceptive.    OB History  Gravida Para Term Preterm AB Living  0 0 0 0 0 0  SAB IAB Ectopic Multiple Live Births  0 0 0 0 0    Past Medical History:  Diagnosis Date  . Depression   . Dysmenorrhea   . Menorrhagia     Past Surgical History:  Procedure Laterality Date  . ADENOIDECTOMY Bilateral 04/07/2019   Procedure: ADENOIDECTOMY;  Surgeon: Linus Salmons, MD;  Location: [facility];  Service: ENT;  Laterality: Bilateral;  . COLONOSCOPY    . TONSILLECTOMY Bilateral 04/07/2019   Procedure: TONSILLECTOMY;  Surgeon: Linus Salmons, MD;  Location: [facility];  Service: ENT;  Laterality: Bilateral;  . WISDOM TOOTH EXTRACTION  2013     Family History  Problem Relation Age of Onset  . Healthy Mother   . Healthy Father   . Cancer Neg Hx   . Diabetes Neg Hx   . Heart disease Neg Hx     Social History   Socioeconomic History  . Marital status: Single    Spouse name: Not on file  . Number of children: Not on file  . Years of education: Not on file  . Highest education level: Not on file  Occupational History  . Not on file  Tobacco Use  . Smoking status: Never Smoker  . Smokeless tobacco: Never Used  Vaping Use  . Vaping Use: Never used  Substance and Sexual Activity  . Alcohol use: Yes    Comment: socially (1x/wk)  . Drug use: No  . Sexual activity: Yes    Birth control/protection: Pill, Condom  Other Topics Concern  . Not on file  Social History Narrative  . Not on file   Social Determinants of Health   Financial Resource Strain: Not on file  Food Insecurity: Not on file  Transportation Needs: Not on file  Physical Activity: Not on file  Stress: Not on file  Social Connections: Not on file  Intimate Partner Violence: Not on file    Current Outpatient Medications on File Prior to Visit  Medication Sig Dispense Refill  . CHARLOTTE 24 FE 1-20 MG-MCG(24) CHEW TAKE 1 TABLET BY MOUTH EVERY DAY 84  tablet 3  . sertraline (ZOLOFT) 50 MG tablet Take 1 tablet (50 mg total) by mouth daily. 45 tablet 0  . [DISCONTINUED] [place] 24 FE 1-20 MG-MCG(24) CHEW TAKE 1 TABLET BY MOUTH EVERY DAY 28 tablet 1  . [DISCONTINUED] [place]TE 24 FE 1-20 MG-MCG(24) CHEW TAKE 1 TABLET BY MOUTH EVERY DAY 84 tablet 0   No current facility-administered medications on file prior to visit.    No Known Allergies    Review of Systems Constitutional: negative for chills, fatigue, fevers and sweats Eyes: negative for irritation, redness and visual disturbance Ears, nose, mouth, throat, and face: negative for hearing loss, nasal congestion, snoring and tinnitus.   Respiratory: negative for asthma, cough,  sputum Cardiovascular: negative for chest pain, dyspnea, exertional chest pressure/discomfort, irregular heart beat, palpitations and syncope Gastrointestinal: negative for abdominal pain, change in bowel habits, nausea and vomiting Genitourinary: negative for abnormal menstrual periods, genital lesions, sexual problems and vaginal discharge, dysuria and urinary incontinence Integument/breast: negative for breast lump, breast tenderness and nipple discharge Hematologic/lymphatic: negative for bleeding and easy bruising Musculoskeletal:negative for back pain and muscle weakness Neurological: negative for dizziness, headaches, vertigo and weakness Endocrine: negative for diabetic symptoms including polydipsia, polyuria and skin dryness Allergic/Immunologic: negative for hay fever and urticaria      Objective:  Blood pressure 124/85, pulse (!) 109, height 5\' 4"  (1.626 m), weight 135 lb 3.2 oz (61.3 kg), last menstrual period 07/20/2020. Body mass index is 23.21 kg/m.  General Appearance:    Alert, cooperative, no distress, appears stated age  Head:    Normocephalic, without obvious abnormality, atraumatic  Eyes:    PERRL, conjunctiva/corneas clear, EOM's intact, both eyes  Ears:    Normal external ear canals, both ears  Nose:   Nares normal, septum midline, mucosa normal, no drainage or sinus tenderness  Throat:   Lips, mucosa, and tongue normal; Teeth and gums normal  Neck:   Supple, symmetrical, trachea midline, no adenopathy; thyroid: no enlargement/tenderness/nodules; no carotid bruit or JVD  Back:     Symmetric, no curvature, ROM normal, no CVA tenderness  Lungs:     Clear to auscultation bilaterally, respirations unlabored  Chest Wall:    No tenderness or deformity   Heart:    Regular rate and rhythm, S1 and S2 normal, no murmur, rub or gallop  Breast Exam:    No tenderness, masses, or nipple abnormality  Abdomen:     Soft, non-tender, bowel sounds active all four quadrants, no masses,  no organomegaly.    Genitalia:    Pelvic:external genitalia normal, vagina without lesions, discharge, or tenderness, rectovaginal septum  normal. Cervix normal in appearance, no cervical motion tenderness, no adnexal masses or tenderness.  Uterus normal size, shape, mobile, regular contours, nontender.  Rectal:    Normal external sphincter.  No hemorrhoids appreciated. Internal exam not done.   Extremities:   Extremities normal, atraumatic, no cyanosis or edema  Pulses:   2+ and symmetric all extremities  Skin:   Skin color, texture, turgor normal, no rashes or lesions  Lymph nodes:   Cervical, supraclavicular, and axillary nodes normal  Neurologic:   CNII-XII intact, normal strength, sensation and reflexes throughout   .  Labs:  Lab Results  Component Value Date   WBC 6.0 03/31/2019   HGB 13.3 03/31/2019   HCT 39.1 03/31/2019   MCV 86 03/31/2019   PLT 292 03/31/2019    Lab Results  Component Value Date   CREATININE 0.83 03/31/2019   BUN 9 03/31/2019  NA 139 03/31/2019   K 4.6 03/31/2019   CL 102 03/31/2019   CO2 22 03/31/2019    Lab Results  Component Value Date   ALT 7 03/31/2019   AST 13 03/31/2019   ALKPHOS 84 03/31/2019   BILITOT 0.3 03/31/2019    Lab Results  Component Value Date   TSH 2.340 03/31/2019     Assessment:   Routine gynecologic exam.  Sore throat H/o depression   Plan:    Blood tests: Labs reviewed in Care Everywhere (performed in March at [facility]).   Breast self exam technique reviewed and patient encouraged to perform self-exam monthly. Contraception: OCP (estrogen/progesterone). Reviewed hormonal and nonhormonal IUDs at patient's request. Notes that she is considering IUD placement, interested in [place]. Given handout to review, and can schedule appointment when ready for placement.  Pap smear up to date. Due in 1 year.  Is up to date on flu vaccine.  COVID vaccination status: up to date. Is eligible for booster.  Continue to encourage  HPV vaccine. Given handout.  H/o depression, currently managed on Zoloft. Is followed by her therapist.  Follow up in 1 year for annual exam.   Hildred Laser, MD [facility]

## 2020-10-16 ENCOUNTER — Other Ambulatory Visit: Payer: Self-pay | Admitting: Obstetrics and Gynecology

## 2021-09-17 ENCOUNTER — Other Ambulatory Visit: Payer: Self-pay | Admitting: Obstetrics and Gynecology

## 2021-09-22 ENCOUNTER — Telehealth: Payer: Self-pay

## 2021-09-22 NOTE — Telephone Encounter (Signed)
We can give her a 3 month refill and she can have a televisit to follow up with her mood symptoms.  However if she will be residing in [place] more long-term, she will need to establish with a provider there.  If she plans to return to [place] sometime this year, then she needs to be scheduled for a physical.

## 2021-09-23 MED ORDER — SERTRALINE HCL 50 MG PO TABS: 50.0000 mg | ORAL_TABLET | Freq: Every day | ORAL | 0 refills | Status: AC

## 2021-09-23 NOTE — Telephone Encounter (Signed)
Medication was sent. Contacted patient via mychart to inform her that video visit needs to be scheduled for Mood Check.

## 2021-09-29 ENCOUNTER — Other Ambulatory Visit: Payer: Self-pay | Admitting: Obstetrics and Gynecology

## 2021-10-06 ENCOUNTER — Telehealth: Payer: Self-pay | Admitting: Obstetrics and Gynecology

## 2021-10-06 NOTE — Telephone Encounter (Signed)
Called patient to inform her that she needs to schedule an appointment to get her Zoloft. She also needs her birth control refilled. She has not been seen since 2021. Please advise.

## 2021-10-06 NOTE — Telephone Encounter (Signed)
Pt called about RX that pharmacy states there [place] no refills on. Confirmed pharmacy as [facility] in [place]. Rx: birth control. Please Advise.

## 2021-11-20 ENCOUNTER — Encounter: Payer: Self-pay | Admitting: Obstetrics and Gynecology

## 2021-11-24 ENCOUNTER — Ambulatory Visit: Payer: BC Managed Care – PPO | Admitting: Dermatology

## 2021-11-24 DIAGNOSIS — B351 Tinea unguium: Secondary | ICD-10-CM

## 2021-11-24 DIAGNOSIS — Z7189 Other specified counseling: Secondary | ICD-10-CM

## 2021-11-24 DIAGNOSIS — B353 Tinea pedis: Secondary | ICD-10-CM | POA: Diagnosis not present

## 2021-11-24 MED ORDER — CICLOPIROX OLAMINE 0.77 % EX CREA: TOPICAL_CREAM | CUTANEOUS | 2 refills | Status: AC

## 2021-11-24 MED ORDER — TERBINAFINE HCL 250 MG PO TABS: 250.0000 mg | ORAL_TABLET | Freq: Every day | ORAL | 0 refills | Status: DC

## 2021-11-24 NOTE — Progress Notes (Signed)
? ?  New Patient Visit ? ?Subjective  ?Ashley Blevins is a 26 y.o. female who presents for the following: Rash (Patient here today for peeling and itching at right foot. Patient advises it started years ago at toenail and has spread over the last year. ). ? ? ?The following portions of the chart were reviewed this encounter and updated as appropriate:  ?  ?  ? ?Review of Systems:  No other skin or systemic complaints except as noted in HPI or Assessment and Plan. ? ?Objective  ?Well appearing patient in no apparent distress; mood and affect are within normal limits. ? ?A focused examination was performed including right foot and toenails. Relevant physical exam findings are noted in the Assessment and Plan. ? ?right foot ?Erythema and scale at webspaces, pt complains of itching ? ?right great toe ?Nail dystrophy with yellow white discoloration and purpuric macule at base ? ? ? ? ?Assessment & Plan  ?Tinea pedis of right foot ?right foot ? ?Start ciclopirox cream twice to foot and nails daily x 2 weeks then may decrease to once daily until clear.  ? ? ?Tinea unguium ?right great toe ? ?Chronic fungal infection of toenail ? ?Start terbinafine 250 mg once daily with food (will need 3 mo course) ?Patient with no h/o liver problems, most recent LFTS reviewed (2021) were normal ? ?Terbinafine Counseling ? ?Terbinafine is an anti-fungal medicine that can be applied to the skin (over the counter) or taken by mouth (prescription) to treat fungal infections. The pill version is often used to treat fungal infections of the nails or scalp. While most people do not have any side effects from taking terbinafine pills, some possible side effects of the medicine can include taste changes, headache, loss of smell, vision changes, nausea, vomiting, or diarrhea.  ? ?Rare side effects can include irritation of the liver, allergic reaction, or decrease in blood counts (which may show up as not feeling well or developing an infection). If  you are concerned about any of these side effects, please stop the medicine and call your doctor, or in the case of an emergency such as feeling very unwell, seek immediate medical care.  ? ? ?ciclopirox (LOPROX) 0.77 % cream - right great toe ?Apply twice daily for 2 weeks to foot and toenails then may decrease to once daily until clear. ? ?terbinafine (LAMISIL) 250 MG tablet - right great toe ?Take 1 tablet (250 mg total) by mouth daily. ? ? ?Return in about 4 weeks (around 12/22/2021) for virtual appt in 1 month. ? ?Anise Salvo, RMA, am acting as scribe for Willeen Niece, MD . ? ?Documentation: I have reviewed the above documentation for accuracy and completeness, and I agree with the above. ? ?Willeen Niece MD  ? ? ?

## 2021-11-24 NOTE — Patient Instructions (Addendum)
Start terbinafine 250 mg once daily with food ?Start ciclopirox cream twice to foot and nails daily x 2 weeks then may decrease to once daily until clear.  ? ?Terbinafine Counseling ? ?Terbinafine is an anti-fungal medicine that can be applied to the skin (over the counter) or taken by mouth (prescription) to treat fungal infections. The pill version is often used to treat fungal infections of the nails or scalp. While most people do not have any side effects from taking terbinafine pills, some possible side effects of the medicine can include taste changes, headache, loss of smell, vision changes, nausea, vomiting, or diarrhea.  ? ?Rare side effects can include irritation of the liver, allergic reaction, or decrease in blood counts (which may show up as not feeling well or developing an infection). If you are concerned about any of these side effects, please stop the medicine and call your doctor, or in the case of an emergency such as feeling very unwell, seek immediate medical care.  ? ? ?If You Need Anything After Your Visit ? ?If you have any questions or concerns for your doctor, please call our main line at [phone] and press option 4 to reach your doctor's medical assistant. If no one answers, please leave a voicemail as directed and we will return your call as soon as possible. Messages left after 4 pm will be answered the following business day.  ? ?You may also send [place] a message via MyChart. We typically respond to MyChart messages within 1-2 business days. ? ?For prescription refills, please ask your pharmacy to contact our office. Our fax number is [phone]. ? ?If you have an urgent issue when the clinic is closed that cannot wait until the next business day, you can page your doctor at the number below.   ? ?Please note that while we do our best to be available for urgent issues outside of office hours, we are not available 24/7.  ? ?If you have an urgent issue and are unable to reach [place], you  may choose to seek medical care at your doctor's office, retail clinic, urgent care center, or emergency room. ? ?If you have a medical emergency, please immediately call 911 or go to the emergency department. ? ?Pager Numbers ? ?- Dr. Gwen Pounds: [phone] ? ?- Dr. Neale Burly: [phone] ? ?- Dr. Roseanne Reno: [phone] ? ?In the event of inclement weather, please call our main line at [phone] for an update on the status of any delays or closures. ? ?Dermatology Medication Tips: ?Please keep the boxes that topical medications come in in order to help keep track of the instructions about where and how to use these. Pharmacies typically print the medication instructions only on the boxes and not directly on the medication tubes.  ? ?If your medication is too expensive, please contact our office at [phone] option 4 or send [place] a message through MyChart.  ? ?We are unable to tell what your co-pay for medications will be in advance as this is different depending on your insurance coverage. However, we may be able to find a substitute medication at lower cost or fill out paperwork to get insurance to cover a needed medication.  ? ?If a prior authorization is required to get your medication covered by your insurance company, please allow [place] 1-2 business days to complete this process. ? ?Drug prices often vary depending on where the prescription is filled and some pharmacies may offer cheaper prices. ? ?The website www.goodrx.com contains coupons for medications  through different pharmacies. The prices here do not account for what the cost may be with help from insurance (it may be cheaper with your insurance), but the website can give you the price if you did not use any insurance.  ?- You can print the associated coupon and take it with your prescription to the pharmacy.  ?- You may also stop by our office during regular business hours and pick up a GoodRx coupon card.  ?- If you need your prescription sent  electronically to a different pharmacy, notify our office through [facility] My[facility] or by phone at [phone] option 4. ? ? ? ? ?Si Usted Necesita Algo Despu?s de Su Visita ? ?Tambi?n puede enviarnos un mensaje a trav?s de MyChart. Por lo general respondemos a los mensajes de MyChart en el transcurso de 1 a 2 d?as h?biles. ? ?Para renovar recetas, por favor pida a su farmacia que se ponga en contacto con nuestra oficina. Nuestro n?mero de fax es el [phone]. ? ?Si tiene un asunto urgente cuando la cl?nica est? cerrada y que no puede esperar hasta el siguiente d?a h?bil, puede llamar/localizar a su doctor(a) al n?mero que aparece a continuaci?n.  ? ?Por favor, tenga en cuenta que aunque hacemos todo lo posible para estar disponibles para asuntos urgentes fuera del horario de oficina, no estamos disponibles las 24 horas del d?a, los 7 d?as de la semana.  ? ?Si tiene un problema urgente y no puede comunicarse con nosotros, puede optar por buscar atenci?n m?dica  en el consultorio de su doctor(a), en una cl?nica privada, en un centro de atenci?n urgente o en una sala de emergencias. ? ?Si tiene Radio broadcast assistantuna emergencia m?dica, por favor llame inmediatamente al 911 o vaya a la sala de emergencias. ? ?N?meros de b?per ? ?- Dr. Gwen PoundsKowalski: [phone] ? ?- Dra. Moye: [phone] ? ?- Dra. Roseanne RenoStewart:: [phone] ? ?En caso de inclemencias del tiempo, por favor llame a nuestra l?nea principal al [phone] para una actualizaci?n sobre el estado de cualquier retraso o cierre. ? ?Consejos para la medicaci?n en dermatolog?a: ?Por favor, guarde las cajas en las que vienen los medicamentos de uso t?pico para ayudarle a seguir las instrucciones sobre d?nde y c?mo usarlos. Las farmacias generalmente imprimen las instrucciones del medicamento s?lo en las cajas y no directamente en los tubos del [place].  ? ?Si su medicamento es muy caro, por favor, p?ngase en contacto con Rolm Galanuestra oficina llamando al [phone] y presione la  opci?n 4 o env?enos un mensaje a trav?s de MyChart.  ? ?No podemos decirle cu?l ser? su copago por los medicamentos por adelantado ya que esto es diferente dependiendo de la cobertura de su seguro. Sin embargo, es posible que podamos encontrar un medicamento sustituto a Audiological scientistmenor costo o llenar un formulario para que el seguro cubra el medicamento que se considera necesario.  ? ?Si se requiere Neomia Dearuna autorizaci?n previa para que su compa??a de seguros [place] su medicamento, por favor perm?tanos de 1 a 2 d?as h?biles para completar este proceso. ? ?Los precios de los medicamentos var?an con frecuencia dependiendo del Environmental consultantlugar de d?nde se surte la receta y alguna farmacias pueden ofrecer precios m?s baratos. ? ?El sitio web www.goodrx.com tiene cupones para medicamentos de Health and safety inspectordiferentes farmacias. Los precios aqu? no tienen en cuenta lo que podr?a costar con la ayuda del seguro (puede ser m?s barato con su seguro), pero el sitio web puede darle el precio si no utiliz? ning?n seguro.  ?- Puede imprimir el cup?n correspondiente y llevarlo  con su receta a la farmacia.  ?- Tambi?n puede pasar por nuestra oficina durante el horario de atenci?n regular y recoger una tarjeta de cupones de GoodRx.  ?- Si necesita que su receta se env?e electr?nicamente a Psychiatrist, informe a nuestra oficina a trav?s de MyChart de [facility] o por tel?fono llamando al [phone] y presione la opci?n 4. ? ?

## 2021-11-25 ENCOUNTER — Encounter: Payer: Self-pay | Admitting: Obstetrics and Gynecology

## 2021-12-23 ENCOUNTER — Other Ambulatory Visit: Payer: Self-pay | Admitting: Obstetrics and Gynecology

## 2021-12-30 ENCOUNTER — Telehealth: Payer: BC Managed Care – PPO | Admitting: Dermatology

## 2021-12-30 DIAGNOSIS — B351 Tinea unguium: Secondary | ICD-10-CM | POA: Diagnosis not present

## 2021-12-30 DIAGNOSIS — B353 Tinea pedis: Secondary | ICD-10-CM | POA: Diagnosis not present

## 2021-12-30 MED ORDER — TERBINAFINE HCL 250 MG PO TABS: 250.0000 mg | ORAL_TABLET | Freq: Every day | ORAL | 1 refills | Status: AC

## 2021-12-30 NOTE — Progress Notes (Signed)
? ?  Follow-Up Visit ?  ?Subjective  ?Ashley Blevins is a 26 y.o. female who presents for the following: Follow-up (Patient following up for tinea pedis and tinea unguium. Patient advises she is taking 250 mg of terbinafine daily and using ciclopirox occasionally. Patient has noticed improvement and is not having any side effects. ). Tolerating oral medication well. ? ?Virtual video visit today ?Patient located in [place] ?Physician located in [place], [place] ?Patient consents to telehealth visit today including potential limitations and voices understanding their insurance will be billed just like for a regular in-office visit ?Virtual visit duration 11 minutes ? ? ?The following portions of the chart were reviewed this encounter and updated as appropriate:  ?  ?  ? ?Review of Systems:  No other skin or systemic complaints except as noted in HPI or Assessment and Plan. ? ?Objective  ?Well appearing patient in no apparent distress; mood and affect are within normal limits. ? ?A focused examination was performed including face, right foot and toes. Relevant physical exam findings are noted in the Assessment and Plan. ? ?Right Foot ?Clear  ? ?right great toe ?Nail polish on.  Pt will take photo and send through mychart ? ? ? ?Assessment & Plan  ?Tinea pedis of right foot ?Right Foot ? ?Tinea unguium ?right great toe ? ?Chronic and persistent condition with duration or expected duration over one year. Condition is symptomatic/ bothersome to patient. Not currently at goal.  ? ?Continue taking terbinafine 250 mg once daily, tolerating well ?Continue ciclopirox cream qd to feet as needed ? ?Patient will take photo of right great toenail after she removes nail polish and send a picture via MyChart. ? ?terbinafine (LAMISIL) 250 MG tablet - right great toe ?Take 1 tablet (250 mg total) by mouth daily. ? ?Related Medications ?ciclopirox (LOPROX) 0.77 % cream ?Apply twice daily for 2 weeks to foot and toenails then may decrease  to once daily until clear. ? ? ?Return in about 2 months (around 03/01/2022) for tinea unguium. ? ?Anise Salvo, RMA, am acting as scribe for Willeen Niece, MD . ? ?Documentation: I have reviewed the above documentation for accuracy and completeness, and I agree with the above. ? ?Willeen Niece MD  ? ?

## 2023-11-15 ENCOUNTER — Ambulatory Visit: Admitting: Dermatology

## 2023-11-15 DIAGNOSIS — L700 Acne vulgaris: Secondary | ICD-10-CM

## 2023-11-15 MED ORDER — ADAPALENE-BENZOYL PEROXIDE 0.3-2.5 % EX GEL: 1.0000 | Freq: Every day | CUTANEOUS | 3 refills | Status: AC

## 2023-11-15 MED ORDER — SPIRONOLACTONE 100 MG PO TABS: 100.0000 mg | ORAL_TABLET | Freq: Every day | ORAL | 6 refills | Status: DC

## 2023-11-15 NOTE — Patient Instructions (Addendum)
 Start Epiduo Forte to face nightly as tolerated, can bleach towels, clothes, pillow case Can use "sandwich method" with moisturizer - apply moisturizer, then medication, then moisturizer.  Topical retinoid medications like tretinoin/Retin-A, adapalene/Differin, tazarotene/Fabior, and Epiduo/Epiduo Forte can cause dryness and irritation when first started. Only apply a pea-sized amount to the entire affected area. Avoid applying it around the eyes, edges of mouth and creases at the nose. If you experience irritation, use a good moisturizer first and/or apply the medicine less often. If you are doing well with the medicine, you can increase how often you use it until you are applying every night. Be careful with sun protection while using this medication as it can make you sensitive to the sun. This medicine should not be used by pregnant women.   Benzoyl peroxide can cause dryness and irritation of the skin. It can also bleach fabric. When used together with Aczone (dapsone) cream, it can stain the skin orange.  Start Spironolactone 100mg  1 pill a day, start out with 1/2 a pill for a week or two and if no side effects can increase to 1 pill a day Spironolactone can cause increased urination and cause blood pressure to decrease. Please watch for signs of lightheadedness and be cautious when changing position. It can sometimes cause breast tenderness or an irregular period in premenopausal women. It can also increase potassium. The increase in potassium usually is not a concern unless you are taking other medicines that also increase potassium, so please be sure your doctor knows all of the other medications you are taking. This medication should not be taken by pregnant women.  This medicine should also not be taken together with sulfa drugs like Bactrim (trimethoprim/sulfamethexazole).      Due to recent changes in healthcare laws, you may see results of your pathology and/or laboratory studies on  MyChart before the doctors have had a chance to review them. We understand that in some cases there may be results that are confusing or concerning to you. Please understand that not all results are received at the same time and often the doctors may need to interpret multiple results in order to provide you with the best plan of care or course of treatment. Therefore, we ask that you please give [place] 2 business days to thoroughly review all your results before contacting the office for clarification. Should we see a critical lab result, you will be contacted sooner.   If You Need Anything After Your Visit  If you have any questions or concerns for your doctor, please call our main line at [phone] and press option 4 to reach your doctor's medical assistant. If no one answers, please leave a voicemail as directed and we will return your call as soon as possible. Messages left after 4 pm will be answered the following business day.   You may also send [place] a message via MyChart. We typically respond to MyChart messages within 1-2 business days.  For prescription refills, please ask your pharmacy to contact our office. Our fax number is [phone].  If you have an urgent issue when the clinic is closed that cannot wait until the next business day, you can page your doctor at the number below.    Please note that while we do our best to be available for urgent issues outside of office hours, we are not available 24/7.   If you have an urgent issue and are unable to reach [place], you may choose to seek medical  care at your doctor's office, retail clinic, urgent care center, or emergency room.  If you have a medical emergency, please immediately call 911 or go to the emergency department.  Pager Numbers  - Dr. Gwen Pounds: [phone]  - Dr. Roseanne Reno: [phone]  - Dr. Katrinka Blazing: [phone]   In the event of inclement weather, please call our main line at [phone] for an update on the status of  any delays or closures.  Dermatology Medication Tips: Please keep the boxes that topical medications come in in order to help keep track of the instructions about where and how to use these. Pharmacies typically print the medication instructions only on the boxes and not directly on the medication tubes.   If your medication is too expensive, please contact our office at [phone] option 4 or send [place] a message through MyChart.   We are unable to tell what your co-pay for medications will be in advance as this is different depending on your insurance coverage. However, we may be able to find a substitute medication at lower cost or fill out paperwork to get insurance to cover a needed medication.   If a prior authorization is required to get your medication covered by your insurance company, please allow [place] 1-2 business days to complete this process.  Drug prices often vary depending on where the prescription is filled and some pharmacies may offer cheaper prices.  The website www.goodrx.com contains coupons for medications through different pharmacies. The prices here do not account for what the cost may be with help from insurance (it may be cheaper with your insurance), but the website can give you the price if you did not use any insurance.  - You can print the associated coupon and take it with your prescription to the pharmacy.  - You may also stop by our office during regular business hours and pick up a GoodRx coupon card.  - If you need your prescription sent electronically to a different pharmacy, notify our office through [facility] or by phone at [phone] option 4.     Si Usted Necesita Algo Despus de Su Visita  Tambin puede enviarnos un mensaje a travs de Clinical cytogeneticist. Por lo general respondemos a los mensajes de MyChart en el transcurso de 1 a 2 das hbiles.  Para renovar recetas, por favor pida a su farmacia que se ponga en contacto con nuestra oficina. Annie Sable de fax es [place] [phone].  Si tiene un asunto urgente cuando la clnica est cerrada y que no puede esperar hasta el siguiente da hbil, puede llamar/localizar a su doctor(a) al nmero que aparece a continuacin.   Por favor, tenga en cuenta que aunque hacemos todo lo posible para estar disponibles para asuntos urgentes fuera del horario de [place], no estamos disponibles las 24 horas del da, los 7 [address]  [address] de la [place].   Si tiene un problema urgente y no puede comunicarse con nosotros, puede optar por buscar atencin mdica  en el consultorio de su doctor(a), en una clnica privada, en un centro de atencin urgente o en una sala de emergencias.  Si tiene Engineer, drilling, por favor llame inmediatamente al 911 o vaya a la sala de emergencias.  Nmeros de bper  - Dr. Gwen Pounds: [phone]  - Dra. Roseanne Reno: [phone]  - Dr. Katrinka Blazing: [phone]   En caso de inclemencias del tiempo, por favor llame a Lacy Duverney principal al [phone] para una actualizacin sobre el [place] de cualquier retraso o cierre.  Consejos para  la medicacin en dermatologa: Por favor, guarde las cajas en las que vienen los medicamentos de uso tpico para ayudarle a seguir las instrucciones sobre dnde y cmo usarlos. Las farmacias generalmente imprimen las instrucciones del medicamento slo en las cajas y no directamente en los tubos del [place].   Si su medicamento es muy caro, por favor, pngase en contacto con Rolm Gala llamando al [phone] y presione la opcin 4 o envenos un mensaje a travs de Clinical cytogeneticist.   No podemos decirle cul ser su copago por los medicamentos por adelantado ya que esto es diferente dependiendo de la cobertura de su seguro. Sin embargo, es posible que podamos encontrar un medicamento sustituto a Audiological scientist un formulario para que el seguro cubra el medicamento que se considera necesario.   Si se requiere una autorizacin previa para que su compaa  de seguros [place] su medicamento, por favor permtanos de 1 a 2 das hbiles para completar [address]  Los precios de los medicamentos varan con frecuencia dependiendo del Environmental consultant de dnde se surte la receta y alguna farmacias pueden ofrecer precios ms baratos.  El sitio web www.goodrx.com tiene cupones para medicamentos de Health and safety inspector. Los precios aqu no tienen en cuenta lo que podra costar con la ayuda del seguro (puede ser ms barato con su seguro), pero el sitio web puede darle el precio si no utiliz Tourist information centre manager.  - Puede imprimir el cupn correspondiente y llevarlo con su receta a la farmacia.  - Tambin puede pasar por nuestra oficina durante el horario de atencin regular y Education officer, museum una tarjeta de cupones de GoodRx.  - Si necesita que su receta se enve electrnicamente a una farmacia diferente, informe a nuestra oficina a travs de MyChart de [place] o por telfono llamando al [phone] y presione la opcin 4.

## 2023-11-15 NOTE — Progress Notes (Signed)
   Follow-Up Visit   Subjective  Ashley Blevins is a 28 y.o. female who presents for the following: Acne face, 38m, no treatment, pt worsened after change in birth control- on progestin only BCP   The following portions of the chart were reviewed this encounter and updated as appropriate: medications, allergies, medical history  Review of Systems:  No other skin or systemic complaints except as noted in HPI or Assessment and Plan.  Objective  Well appearing patient in no apparent distress; mood and affect are within normal limits.   A focused examination was performed of the following areas: face  Relevant exam findings are noted in the Assessment and Plan.    Assessment & Plan   ACNE VULGARIS Face BP 124/67 Exam: cystic pap R chin, scattered closed comedones, some inflamed chin  Chronic and persistent condition with duration or expected duration over one year. Condition is bothersome/symptomatic for patient. Currently flared.   Treatment Plan: Start Spironolactone 100mg  1 po qd, may start out with 1/2 a pill for a few days Start Epiduo Forte qhs to lower face for acne as tolerated Recommend mild cleanser, moisturizer  Spironolactone can cause increased urination and cause blood pressure to decrease. Please watch for signs of lightheadedness and be cautious when changing position. It can sometimes cause breast tenderness or an irregular period in premenopausal women. It can also increase potassium. The increase in potassium usually is not a concern unless you are taking other medicines that also increase potassium, so please be sure your doctor knows all of the other medications you are taking. This medication should not be taken by pregnant women.  This medicine should also not be taken together with sulfa drugs like Bactrim (trimethoprim/sulfamethexazole).   Topical retinoid medications like tretinoin/Retin-A, adapalene/Differin, tazarotene/Fabior, and Epiduo/Epiduo Forte can  cause dryness and irritation when first started. Only apply a pea-sized amount to the entire affected area. Avoid applying it around the eyes, edges of mouth and creases at the nose. If you experience irritation, use a good moisturizer first and/or apply the medicine less often. If you are doing well with the medicine, you can increase how often you use it until you are applying every night. Be careful with sun protection while using this medication as it can make you sensitive to the sun. This medicine should not be used by pregnant women.   Benzoyl peroxide can cause dryness and irritation of the skin. It can also bleach fabric. When used together with Aczone (dapsone) cream, it can stain the skin orange.    Return in about 10 weeks (around 01/24/2024) for Acne f/u.  I, Ardis Rowan, RMA, am acting as scribe for Willeen Niece, MD .   Documentation: I have reviewed the above documentation for accuracy and completeness, and I agree with the above.  Willeen Niece, MD

## 2024-01-24 ENCOUNTER — Telehealth: Admitting: Dermatology

## 2024-01-24 ENCOUNTER — Ambulatory Visit: Admitting: Dermatology

## 2024-03-02 ENCOUNTER — Telehealth: Payer: Self-pay

## 2024-03-02 ENCOUNTER — Telehealth: Admitting: Dermatology

## 2024-03-02 ENCOUNTER — Encounter: Payer: Self-pay | Admitting: Dermatology

## 2024-03-02 DIAGNOSIS — L700 Acne vulgaris: Secondary | ICD-10-CM

## 2024-03-02 DIAGNOSIS — Z79899 Other long term (current) drug therapy: Secondary | ICD-10-CM | POA: Diagnosis not present

## 2024-03-02 NOTE — Telephone Encounter (Signed)
 I connected with  Ashley Blevins on 03/02/24 by a video enabled telemedicine application and verified that I am speaking with the correct person using two identifiers.   I discussed the limitations of evaluation and management by telemedicine. The patient expressed understanding and agreed to proceed. lex

## 2024-03-02 NOTE — Progress Notes (Signed)
 Virtual Visit via Video Note   I connected with Ashley Blevins on 03/02/24 at  4:20pm  EDT by a video enabled telemedicine application and verified that I am speaking with the correct person using two identifiers.   Location: Patient name and location:Ashley Blevins, [place]  Provider name and location:[facility] [place] Length of Video Visit: 13 minutes  Patient is aware that insurance will be billed for this appointment.  [facility] is located in [place], [place]   I discussed the limitations of evaluation and management by telemedicine and the availability of in person appointments. The patient expressed understanding and agreed to proceed.   I discussed the assessment and treatment plan with the patient. The patient was provided an opportunity to ask questions and all were answered. The patient agreed with the plan and demonstrated an understanding of the instructions.   The patient was advised to call back or seek an in-person evaluation if the symptoms worsen or if the condition fails to improve as anticipated.       Subjective  Ashley Blevins is a 28 y.o. female who presents for the following: Acne face, Spironolactone  100mg  1 po qd no s/w, Epiduo  Forte qhs, has some sun sensitivity when out in sun, acne improved   The following portions of the chart were reviewed this encounter and updated as appropriate: medications, allergies, medical history  Review of Systems:  No other skin or systemic complaints except as noted in HPI or Assessment and Plan.  Objective  Well appearing patient in no apparent distress; mood and affect are within normal limits.   A focused examination was performed of the following areas: face  Relevant exam findings are noted in the Assessment and Plan.    Assessment & Plan   ACNE VULGARIS face Exam: face clear today  Chronic condition with duration or expected duration over one year. Currently well-controlled.   Treatment Plan: Cont  Spironolactone  100mg  1 po qd Patient advised she is going to talk to her GYN about changing birthcontrol and if she does could she d/c her Spironolactone .  Discussed [facility].  Advised pt if she does change birthcontrol she can overlap the Spironolactone  and new birth control for a few months.  The first month can continue Spironolactone  100mg  1 po qd, the second month she can decrease to Spironolactone  100mg  1/2 po qd and the third month she can d/c the spironolactone  Cont Epiduo  Forte 3-7x/wk Recommend SPF qd  Spironolactone  can cause increased urination and cause blood pressure to decrease. Please watch for signs of lightheadedness and be cautious when changing position. It can sometimes cause breast tenderness or an irregular period in premenopausal women. It can also increase potassium. The increase in potassium usually is not a concern unless you are taking other medicines that also increase potassium, so please be sure your doctor knows all of the other medications you are taking. This medication should not be taken by pregnant women.  This medicine should also not be taken together with sulfa drugs like Bactrim (trimethoprim/sulfamethexazole).   Topical retinoid medications like tretinoin/Retin-A, adapalene /Differin , tazarotene/Fabior, and Epiduo /Epiduo  Forte can cause dryness and irritation when first started. Only apply a pea-sized amount to the entire affected area. Avoid applying it around the eyes, edges of mouth and creases at the nose. If you experience irritation, use a good moisturizer first and/or apply the medicine less often. If you are doing well with the medicine, you can increase how often you use it until you are applying every  night. Be careful with sun protection while using this medication as it can make you sensitive to the sun. This medicine should not be used by pregnant women.         Return for 4-75m Acne f/u video visit, pt will call to schedule.  I, Grayce Saunas, RMA, am  acting as scribe for Rexene Rattler, MD .   Documentation: I have reviewed the above documentation for accuracy and completeness, and I agree with the above.  Rexene Rattler, MD

## 2024-03-02 NOTE — Patient Instructions (Signed)

## 2024-03-11 ENCOUNTER — Other Ambulatory Visit: Payer: Self-pay | Admitting: Dermatology
